# Patient Record
Sex: Female | Born: 1955
Health system: Southern US, Community
[De-identification: ages and names within clinical notes are randomized; demographics above are authoritative.]

## PROBLEM LIST (undated history)

## (undated) DIAGNOSIS — Z8639 Personal history of other endocrine, nutritional and metabolic disease: Secondary | ICD-10-CM

## (undated) DIAGNOSIS — R109 Unspecified abdominal pain: Secondary | ICD-10-CM

## (undated) HISTORY — PX: UPPER GI ENDOSCOPY: SHX6162

---

## 2007-05-04 ENCOUNTER — Encounter (INDEPENDENT_AMBULATORY_CARE_PROVIDER_SITE_OTHER): Payer: Self-pay | Admitting: *Deleted

## 2007-05-04 ENCOUNTER — Encounter: Payer: Self-pay | Admitting: Internal Medicine

## 2007-06-20 ENCOUNTER — Ambulatory Visit: Payer: Self-pay | Admitting: Internal Medicine

## 2007-06-20 DIAGNOSIS — E039 Hypothyroidism, unspecified: Secondary | ICD-10-CM | POA: Insufficient documentation

## 2007-06-20 DIAGNOSIS — Z8639 Personal history of other endocrine, nutritional and metabolic disease: Secondary | ICD-10-CM

## 2007-06-20 DIAGNOSIS — Z862 Personal history of diseases of the blood and blood-forming organs and certain disorders involving the immune mechanism: Secondary | ICD-10-CM

## 2007-06-23 ENCOUNTER — Encounter: Payer: Self-pay | Admitting: Internal Medicine

## 2007-07-02 LAB — CONVERTED CEMR LAB
Basophils Relative: 1 % (ref 0.0–1.0)
Bilirubin, Direct: 0.2 mg/dL (ref 0.0–0.3)
CO2: 31 meq/L (ref 19–32)
Cholesterol: 211 mg/dL (ref 0–200)
Creatinine, Ser: 0.6 mg/dL (ref 0.4–1.2)
Direct LDL: 123.8 mg/dL
Eosinophils Relative: 2.2 % (ref 0.0–5.0)
GFR calc Af Amer: 137 mL/min
Glucose, Bld: 88 mg/dL (ref 70–99)
HCT: 39.6 % (ref 36.0–46.0)
Hemoglobin: 13.7 g/dL (ref 12.0–15.0)
Lymphocytes Relative: 30.1 % (ref 12.0–46.0)
Monocytes Absolute: 0.2 10*3/uL (ref 0.2–0.7)
Monocytes Relative: 3.2 % (ref 3.0–11.0)
Neutro Abs: 3.7 10*3/uL (ref 1.4–7.7)
Neutrophils Relative %: 63.5 % (ref 43.0–77.0)
Potassium: 4.3 meq/L (ref 3.5–5.1)
T3, Free: 3.3 pg/mL (ref 2.3–4.2)
Total Bilirubin: 2.2 mg/dL — ABNORMAL HIGH (ref 0.3–1.2)
Total Protein: 7.6 g/dL (ref 6.0–8.3)
VLDL: 13 mg/dL (ref 0–40)
WBC: 5.9 10*3/uL (ref 4.5–10.5)

## 2007-07-04 ENCOUNTER — Encounter (INDEPENDENT_AMBULATORY_CARE_PROVIDER_SITE_OTHER): Payer: Self-pay | Admitting: *Deleted

## 2007-07-05 ENCOUNTER — Ambulatory Visit: Payer: Self-pay | Admitting: Internal Medicine

## 2007-07-11 ENCOUNTER — Encounter (INDEPENDENT_AMBULATORY_CARE_PROVIDER_SITE_OTHER): Payer: Self-pay | Admitting: *Deleted

## 2007-07-19 ENCOUNTER — Ambulatory Visit: Payer: Self-pay | Admitting: Internal Medicine

## 2007-10-31 ENCOUNTER — Telehealth: Payer: Self-pay | Admitting: Internal Medicine

## 2007-11-09 ENCOUNTER — Encounter (INDEPENDENT_AMBULATORY_CARE_PROVIDER_SITE_OTHER): Payer: Self-pay | Admitting: *Deleted

## 2008-10-09 ENCOUNTER — Ambulatory Visit: Payer: Self-pay | Admitting: Internal Medicine

## 2008-10-10 LAB — CONVERTED CEMR LAB: TSH: 0.04 microintl units/mL — ABNORMAL LOW (ref 0.35–5.50)

## 2008-10-14 ENCOUNTER — Telehealth (INDEPENDENT_AMBULATORY_CARE_PROVIDER_SITE_OTHER): Payer: Self-pay | Admitting: *Deleted

## 2008-10-15 ENCOUNTER — Encounter (HOSPITAL_COMMUNITY): Admission: RE | Admit: 2008-10-15 | Discharge: 2009-01-13 | Payer: Self-pay | Admitting: Internal Medicine

## 2008-10-18 ENCOUNTER — Telehealth (INDEPENDENT_AMBULATORY_CARE_PROVIDER_SITE_OTHER): Payer: Self-pay | Admitting: *Deleted

## 2008-10-23 ENCOUNTER — Encounter: Payer: Self-pay | Admitting: Internal Medicine

## 2009-02-20 ENCOUNTER — Encounter (HOSPITAL_COMMUNITY): Admission: RE | Admit: 2009-02-20 | Discharge: 2009-05-21 | Payer: Self-pay | Admitting: Endocrinology

## 2009-03-07 ENCOUNTER — Ambulatory Visit (HOSPITAL_COMMUNITY): Admission: RE | Admit: 2009-03-07 | Discharge: 2009-03-07 | Payer: Self-pay | Admitting: Endocrinology

## 2010-01-23 ENCOUNTER — Other Ambulatory Visit: Admission: RE | Admit: 2010-01-23 | Discharge: 2010-01-23 | Payer: Self-pay | Admitting: Internal Medicine

## 2010-09-27 ENCOUNTER — Encounter: Payer: Self-pay | Admitting: Internal Medicine

## 2011-06-21 ENCOUNTER — Other Ambulatory Visit (HOSPITAL_COMMUNITY)
Admission: RE | Admit: 2011-06-21 | Discharge: 2011-06-21 | Disposition: A | Discharge status: Home / Self Care | Payer: Managed Care, Other (non HMO) | Source: Ambulatory Visit | Attending: Internal Medicine | Admitting: Internal Medicine

## 2011-06-21 DIAGNOSIS — Z01419 Encounter for gynecological examination (general) (routine) without abnormal findings: Secondary | ICD-10-CM | POA: Insufficient documentation

## 2011-08-18 ENCOUNTER — Other Ambulatory Visit: Payer: Self-pay | Admitting: Internal Medicine

## 2011-08-18 DIAGNOSIS — R29898 Other symptoms and signs involving the musculoskeletal system: Secondary | ICD-10-CM

## 2011-08-23 ENCOUNTER — Ambulatory Visit
Admission: RE | Admit: 2011-08-23 | Discharge: 2011-08-23 | Disposition: A | Discharge status: Home / Self Care | Payer: Managed Care, Other (non HMO) | Source: Ambulatory Visit | Attending: Internal Medicine | Admitting: Internal Medicine

## 2011-08-23 DIAGNOSIS — R29898 Other symptoms and signs involving the musculoskeletal system: Secondary | ICD-10-CM

## 2011-09-30 ENCOUNTER — Other Ambulatory Visit: Payer: Self-pay | Admitting: Internal Medicine

## 2011-09-30 DIAGNOSIS — E041 Nontoxic single thyroid nodule: Secondary | ICD-10-CM

## 2012-02-02 ENCOUNTER — Other Ambulatory Visit: Payer: Managed Care, Other (non HMO)

## 2012-04-03 ENCOUNTER — Ambulatory Visit
Admission: RE | Admit: 2012-04-03 | Discharge: 2012-04-03 | Disposition: A | Discharge status: Home / Self Care | Payer: Managed Care, Other (non HMO) | Source: Ambulatory Visit | Attending: Internal Medicine | Admitting: Internal Medicine

## 2012-04-03 DIAGNOSIS — E041 Nontoxic single thyroid nodule: Secondary | ICD-10-CM

## 2013-02-07 ENCOUNTER — Other Ambulatory Visit: Payer: Self-pay | Admitting: Internal Medicine

## 2013-02-07 DIAGNOSIS — E041 Nontoxic single thyroid nodule: Secondary | ICD-10-CM

## 2013-02-26 ENCOUNTER — Other Ambulatory Visit: Payer: Managed Care, Other (non HMO)

## 2013-10-13 IMAGING — US US SOFT TISSUE HEAD/NECK
1 series · 14 of 25 positions shown · non-contrast
Comparison: None.

CLINICAL DATA: Neck tightness.  History of goiter and
hyperthyroidism.

THYROID ULTRASOUND
TECHNIQUE: Ultrasound examination of the thyroid gland and adjacent
soft tissues was performed.

[Series 1: us soft tissue head/neck · 0.06mm/px · 14 of 44 slices shown]
[im 1/44]
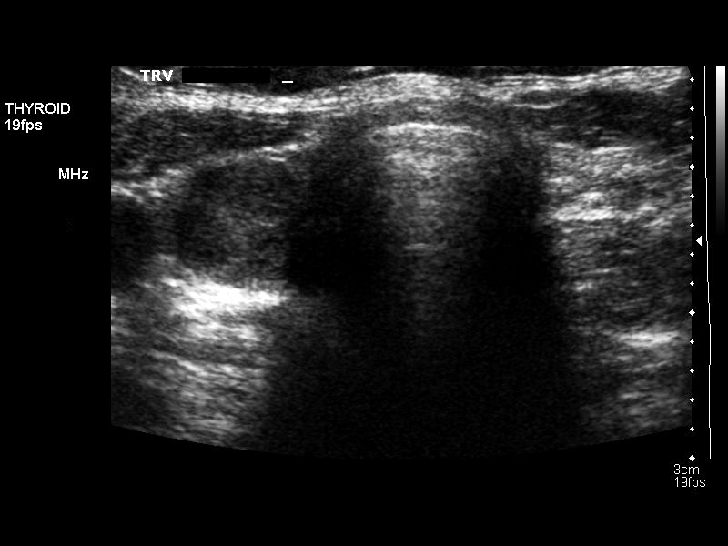
[im 4/44]
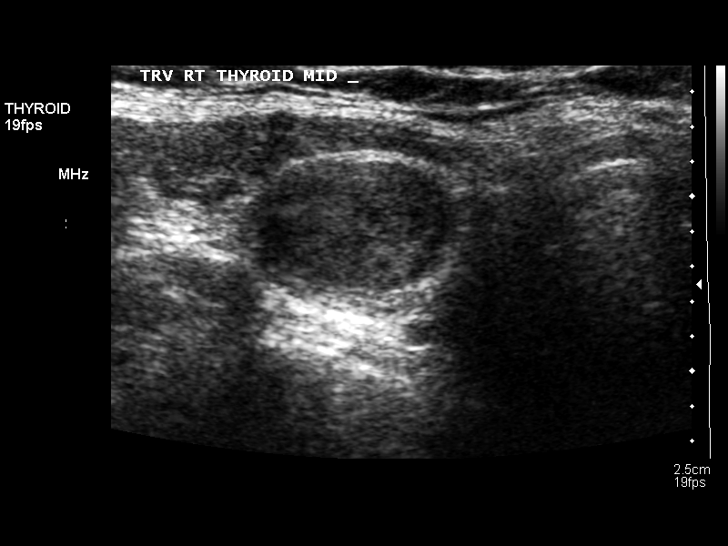
[im 8/44]
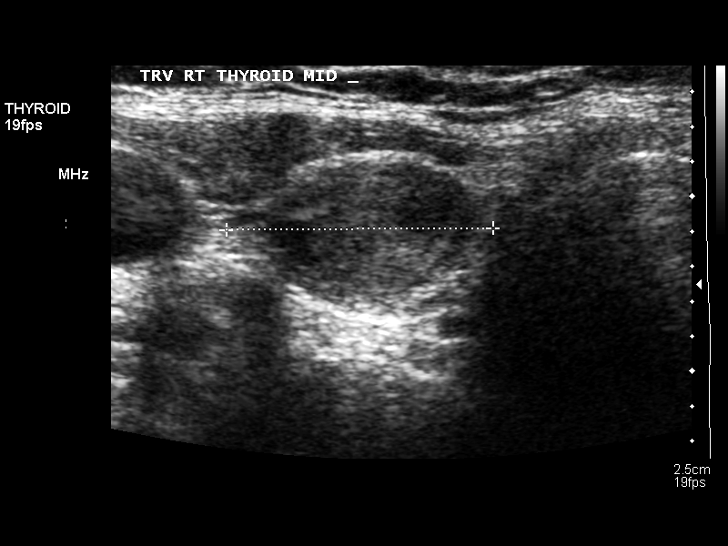
[im 11/44]
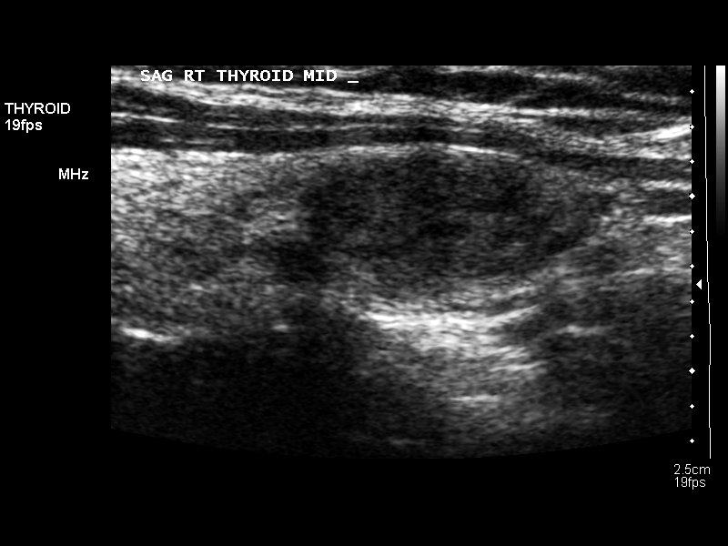
[im 15/44]
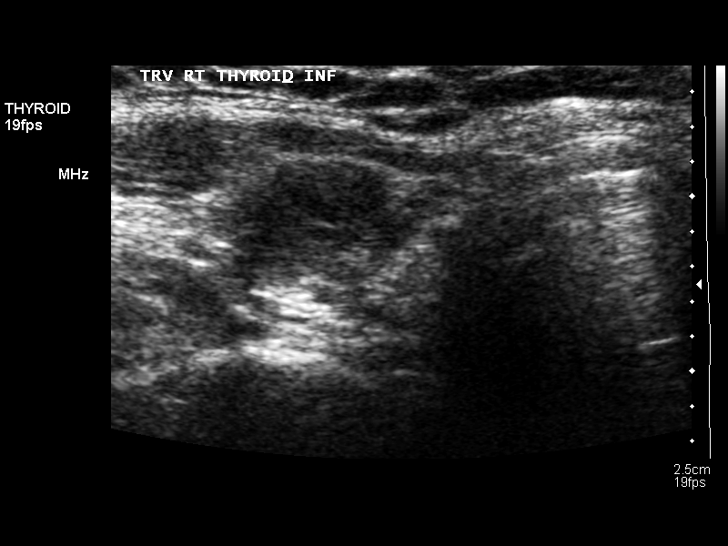
[im 17/44]
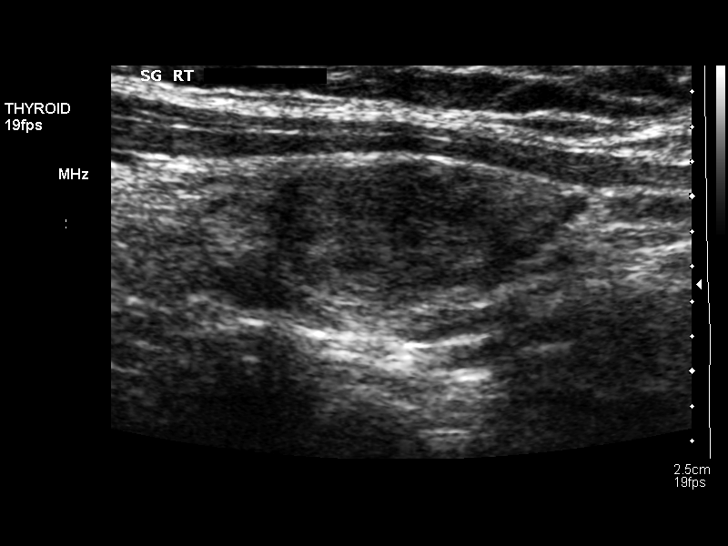
[im 20/44]
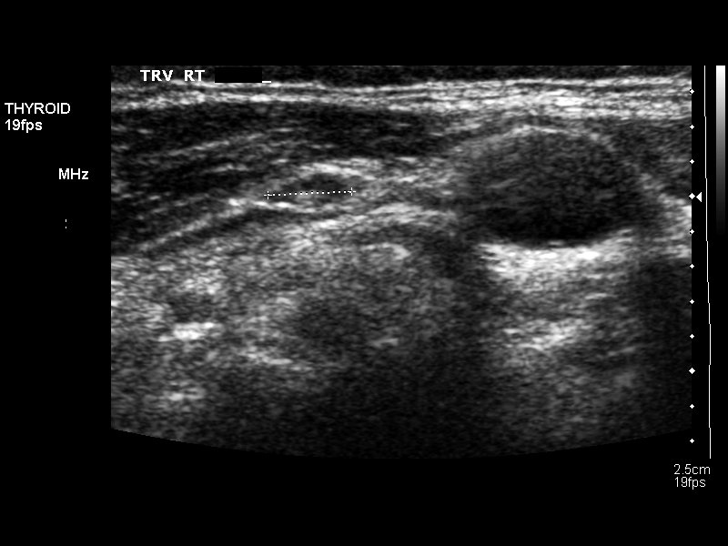
[im 24/44]
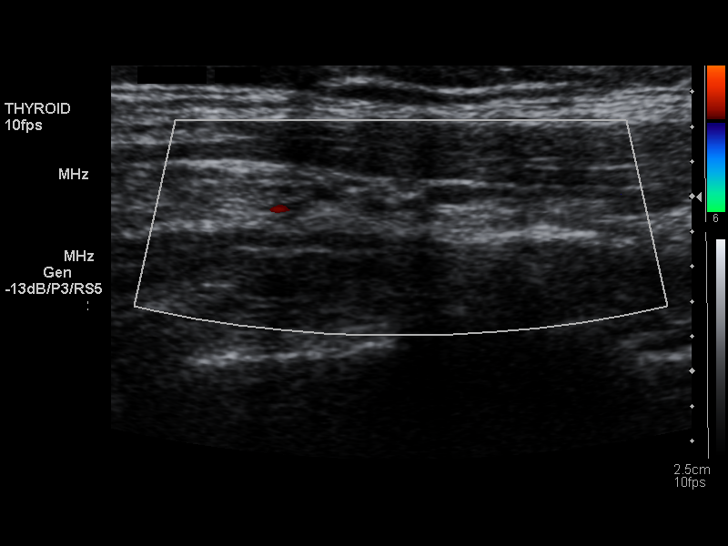
[im 27/44]
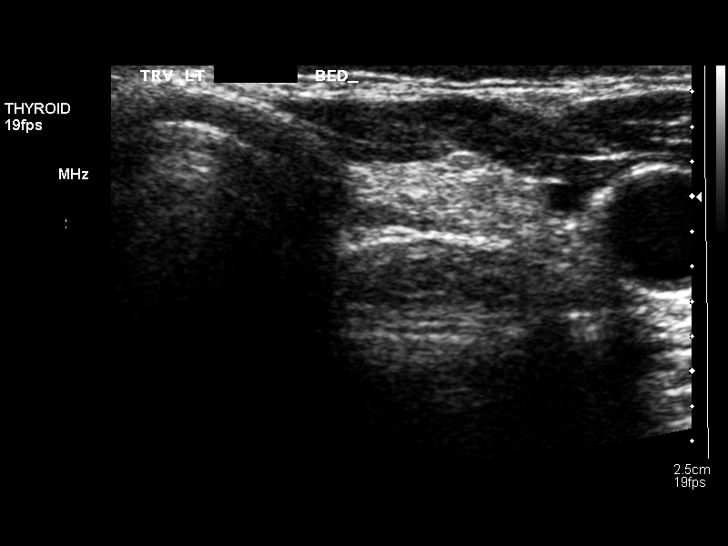
[im 29/44]
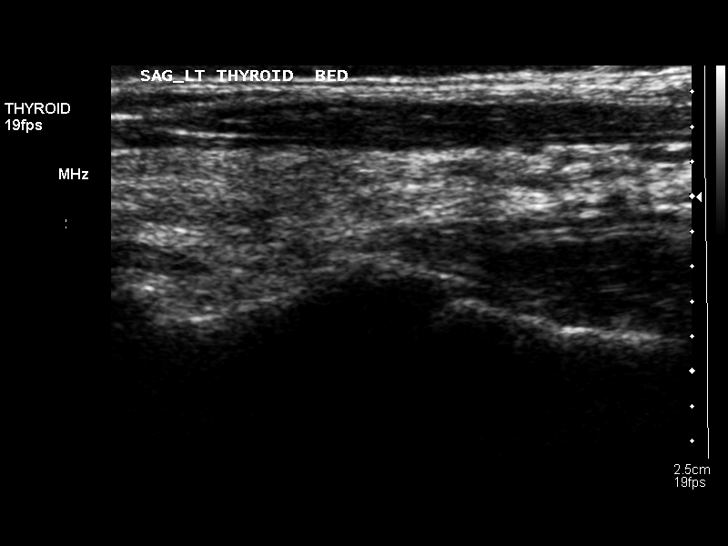
[im 33/44]
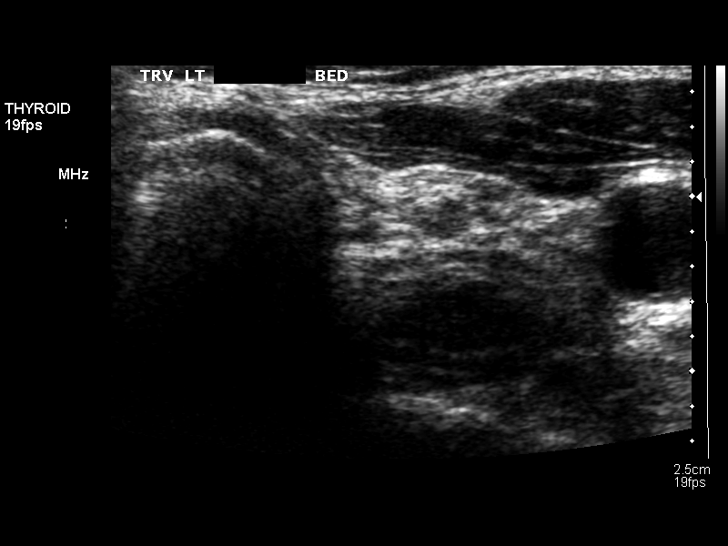
[im 36/44]
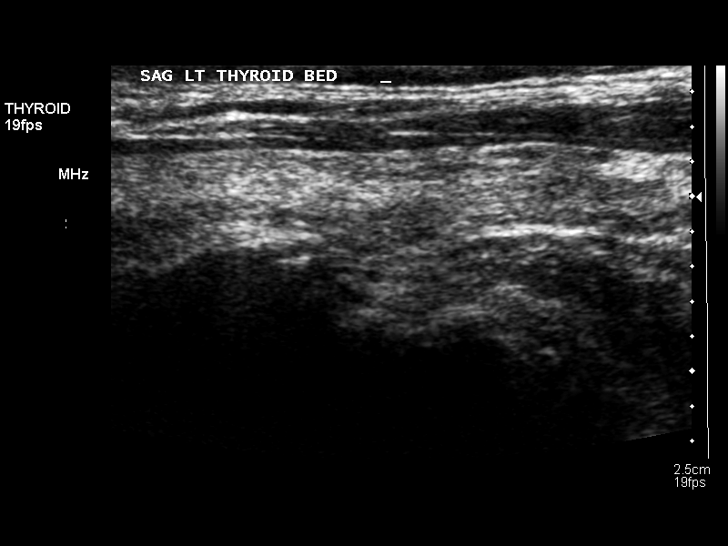
[im 40/44]
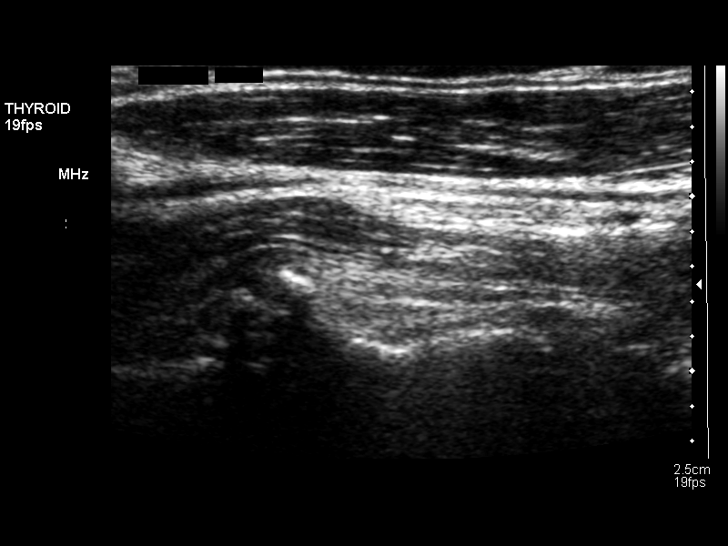
[im 44/44]
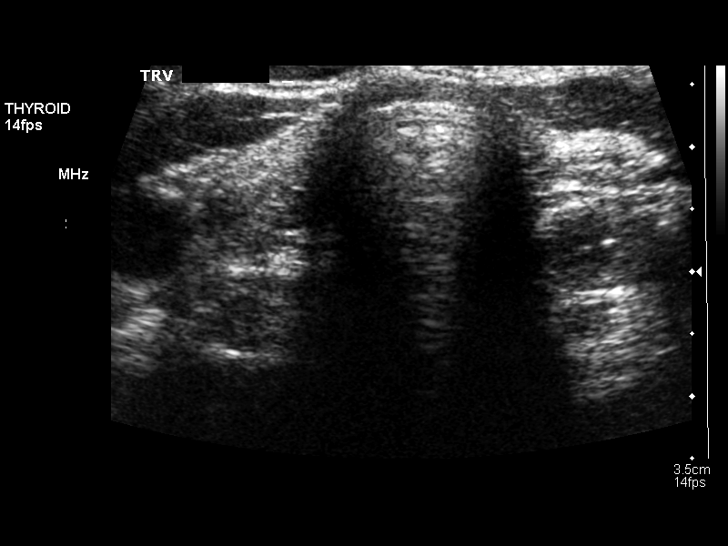

[14 of 25 positions shown; findings below may reference images not displayed]

FINDINGS: Right thyroid lobe:  Ill-defined residual right-sided thyroid lobe
remains.  This measures 2.3 x 1.0 x 1.5 cm.
Left thyroid lobe:  No residual left-sided thyroid tissue
identified.
Isthmus:  No definite residual isthmic tissue identified.

Focal nodules:  1.2 x 0.8 x 1.3 cm nodule is identified within the
anterior aspect of the inter/lower pole right lobe. Solid, and
relatively isoechoic.  No calcifications within.

Lymphadenopathy:  None visualized.
IMPRESSION: 1.  Lack of visualization of the isthmus or left lobe of the
thyroid.  Likely related to prior radioactive iodine therapy.
2. Residual right-sided isthmic tissue with an indeterminate 1.3 cm
nodule identified. Consider ultrasound surveillance at 6 - 12
months.

## 2015-06-23 ENCOUNTER — Other Ambulatory Visit: Payer: Self-pay | Admitting: Internal Medicine

## 2015-06-23 DIAGNOSIS — R945 Abnormal results of liver function studies: Principal | ICD-10-CM

## 2015-06-23 DIAGNOSIS — R7989 Other specified abnormal findings of blood chemistry: Secondary | ICD-10-CM

## 2015-07-07 ENCOUNTER — Ambulatory Visit
Admission: RE | Admit: 2015-07-07 | Discharge: 2015-07-07 | Disposition: A | Discharge status: Home / Self Care | Payer: Managed Care, Other (non HMO) | Source: Ambulatory Visit | Attending: Internal Medicine | Admitting: Internal Medicine

## 2015-07-07 ENCOUNTER — Encounter (INDEPENDENT_AMBULATORY_CARE_PROVIDER_SITE_OTHER): Payer: Self-pay

## 2015-07-07 DIAGNOSIS — R7989 Other specified abnormal findings of blood chemistry: Secondary | ICD-10-CM

## 2015-07-07 DIAGNOSIS — R945 Abnormal results of liver function studies: Principal | ICD-10-CM

## 2016-06-03 ENCOUNTER — Other Ambulatory Visit: Payer: Self-pay | Admitting: Gastroenterology

## 2016-06-04 ENCOUNTER — Encounter (HOSPITAL_COMMUNITY): Payer: Self-pay | Admitting: *Deleted

## 2016-06-07 ENCOUNTER — Encounter (HOSPITAL_COMMUNITY): Payer: Self-pay | Admitting: Anesthesiology

## 2016-06-07 NOTE — Progress Notes (Signed)
Interpreter confirmation email on chart for 06-08-16

## 2016-06-07 NOTE — Anesthesia Preprocedure Evaluation (Deleted)
Anesthesia Evaluation  Patient identified by MRN, date of birth, ID band Patient awake    Reviewed: Allergy & Precautions, NPO status , Patient's Chart, lab work & pertinent test results  Airway        Dental   Pulmonary           Cardiovascular      Neuro/Psych    GI/Hepatic   Endo/Other  Hypothyroidism H/o Grave's disease s/p radiation  Renal/GU      Musculoskeletal   Abdominal   Peds  Hematology   Anesthesia Other Findings   Reproductive/Obstetrics                             Anesthesia Physical Anesthesia Plan  ASA: II  Anesthesia Plan: MAC   Post-op Pain Management:    Induction: Intravenous  Airway Management Planned: Natural Airway and Nasal Cannula  Additional Equipment:   Intra-op Plan:   Post-operative Plan:   Informed Consent:   Dental advisory given  Plan Discussed with: CRNA  Anesthesia Plan Comments:         Anesthesia Quick Evaluation

## 2016-06-08 ENCOUNTER — Ambulatory Visit (HOSPITAL_COMMUNITY)
Admission: RE | Admit: 2016-06-08 | Payer: Managed Care, Other (non HMO) | Source: Ambulatory Visit | Admitting: Gastroenterology

## 2016-06-08 HISTORY — DX: Personal history of other endocrine, nutritional and metabolic disease: Z86.39

## 2016-06-08 HISTORY — DX: Unspecified abdominal pain: R10.9

## 2016-06-08 SURGERY — ESOPHAGOGASTRODUODENOSCOPY (EGD) WITH PROPOFOL
Anesthesia: Monitor Anesthesia Care

## 2016-06-08 MED ORDER — PROPOFOL 10 MG/ML IV BOLUS
INTRAVENOUS | Status: AC
  Filled 2016-06-08: qty 40

## 2019-05-09 ENCOUNTER — Other Ambulatory Visit: Payer: Self-pay | Admitting: Internal Medicine

## 2019-05-09 DIAGNOSIS — R634 Abnormal weight loss: Secondary | ICD-10-CM

## 2019-05-16 ENCOUNTER — Ambulatory Visit
Admission: RE | Admit: 2019-05-16 | Discharge: 2019-05-16 | Disposition: A | Discharge status: Home / Self Care | Payer: No Typology Code available for payment source | Source: Ambulatory Visit | Attending: Internal Medicine | Admitting: Internal Medicine

## 2019-05-16 DIAGNOSIS — R634 Abnormal weight loss: Secondary | ICD-10-CM

## 2019-05-21 ENCOUNTER — Ambulatory Visit: Payer: No Typology Code available for payment source | Attending: Internal Medicine | Admitting: Physical Therapy

## 2019-05-21 ENCOUNTER — Other Ambulatory Visit: Payer: Self-pay

## 2019-05-21 ENCOUNTER — Encounter: Payer: Self-pay | Admitting: Physical Therapy

## 2019-05-21 VITALS — BP 106/64 | HR 74

## 2019-05-21 DIAGNOSIS — R2681 Unsteadiness on feet: Secondary | ICD-10-CM | POA: Diagnosis present

## 2019-05-21 DIAGNOSIS — R42 Dizziness and giddiness: Secondary | ICD-10-CM | POA: Insufficient documentation

## 2019-05-21 NOTE — Therapy (Signed)
[facility] [address]  [address] [place], [place], [address] Phone: [phone]   Fax:  [phone]  Physical Therapy Evaluation  Patient Details  Name: Erin Sims MRN: [id number] Date of Birth: 1956-02-06 Referring Provider (PT): Talmage Coin, MD    Encounter Date: 05/21/2019  PT End of Session - 05/21/19 1203    Visit Number  1    Number of Visits  9    Date for PT Re-Evaluation  06/18/19    Authorization Type  [employer]    PT Start Time  1100    PT Stop Time  1154    PT Time Calculation (min)  54 min    Activity Tolerance  Patient tolerated treatment well    Behavior During Therapy  [facility] for tasks assessed/performed       Past Medical History:  Diagnosis Date  . Abdominal discomfort   . History of Graves' disease    "radaition therapy 2010 to kill thyroid function", now takes supplement" "Synthroid orally only"- Dr. Sharl Ma follows    Past Surgical History:  Procedure Laterality Date  . CESAREAN SECTION     x 1 '82 "[place]"  . UPPER GI ENDOSCOPY     done in [place] "? some ? polyp"    Vitals:   05/21/19 1114  BP: 106/64  Pulse: 74  SpO2: 94%     Subjective Assessment - 05/21/19 1105    Subjective  Patient present with husband. Patient reports that she has been mildly dizzy in the AM as soon as she wakes up, before she starts to move. This has been present for 1 year. However, later states that she has only been dizzy one time- husband states otherwise. Fell in July and is not sure why- said she didn't feel anything. Has been taking her BP at home and is usually 110/72mmHg. Denies dizziness or spinning, otalgia. Described tinnitus in her R ear in August which has now resolved. Last dizzy spell was in August. Does report that she is unable to look around or use her phone when she is in the car because of dizziness. Believes that her dizziness may be caused by her pancreas or pituitary gland. Is supposed to be  scheduled for an MRI on her pancreas soon.    Patient is accompained by:  Family member   husband   Pertinent History  graves disease, thyroid nodule, HLD, osteopenia    Limitations  House hold activities    Patient Stated Goals  get rid of dizziness    Currently in Pain?  No/denies         [facility] PT Assessment - 05/21/19 0001      Assessment   Medical Diagnosis  Vertigo    Referring Provider (PT)  Talmage Coin, MD     Onset Date/Surgical Date  05/20/18    Next MD Visit  not scheduled      Precautions   Precautions  --   osteopenia     Balance Screen   Has the patient fallen in the past 6 months  Yes    How many times?  1    Has the patient had a decrease in activity level because of a fear of falling?   No    Is the patient reluctant to leave their home because of a fear of falling?   No      Home Public house manager residence    Living Arrangements  Spouse/significant other    Available Help at Discharge  Family    Type of Home  House    Home Access  Stairs to enter    Entrance Stairs-Number of Steps  4    Entrance Stairs-Rails  Left    Home Layout  Two level    Alternate Level Stairs-Number of Steps  15    Alternate Level Stairs-Rails  Left      Prior Function   Level of Independence  Independent    Vocation  Unemployed    Vocation Requirements  homemaker    Leisure  none      Cognition   Overall Cognitive Status  Within Functional Limits for tasks assessed      Coordination   Gross Motor Movements are Fluid and Coordinated  Yes      Posture/Postural Control   Posture/Postural Control  Postural limitations    Postural Limitations  Rounded Shoulders;Increased thoracic kyphosis      Ambulation/Gait   Gait Pattern  Step-through pattern;Within Functional Limits    Ambulation Surface  Level;Indoor    Gait velocity  [facility]      Functional Gait  Assessment   Gait assessed   --           Vestibular Assessment - 05/21/19 0001       Oculomotor Exam   Ocular ROM  intact    Spontaneous  Absent    Gaze-induced   Right beating nystagmus with R gaze    Head shaking Horizontal  --    Head Shaking Vertical  --    Smooth Pursuits  Saccades   R beating saccade to R   Saccades  Intact;Slow    Comment  convergence intact      Oculomotor Exam-Fixation Suppressed    Left Head Impulse  negative    Right Head Impulse  negative      Vestibulo-Ocular Reflex   VOR 1 Head Only (x 1 viewing)  horizontal VOR intact; vertical VOR with intermittent difficulty focusing     VOR Cancellation  Corrective saccades   to R     Positional Testing   Dix-Hallpike  Dix-Hallpike Right;Dix-Hallpike Left      Dix-Hallpike Right   Dix-Hallpike Right Symptoms  No nystagmus      Dix-Hallpike Left   Dix-Hallpike Left Symptoms  No nystagmus          Objective measurements completed on examination: See above findings.              PT Education - 05/21/19 1202    Education Details  edu and explanation of test results; encouraged to return for continued testing to assess balance and fall risk    Person(s) Educated  Patient;Spouse    Methods  Explanation;Demonstration    Comprehension  Verbalized understanding       PT Short Term Goals - 05/21/19 1211      PT SHORT TERM GOAL #1   Title  Patient to be independent with initial HEP.    Time  2    Period  Weeks    Status  New    Target Date  06/04/19        PT Long Term Goals - 05/21/19 1211      PT LONG TERM GOAL #1   Title  Patient to be independent with advanced HEP.    Time  4    Period  Weeks    Status  New    Target Date  06/18/19  PT LONG TERM GOAL #2   Title  Patient to score >19 on DGI in order to decrease fall risk.    Time  4    Period  Weeks    Status  New    Target Date  06/18/19      PT LONG TERM GOAL #3   Title  Patient to demonstrate mild sway with M-CTSIB condiction EC/foam surface to improve confidence when walking in the dark and on  unstable surfaces.    Time  4    Period  Weeks    Status  New    Target Date  06/18/19      PT LONG TERM GOAL #4   Title  Patient to report 50% improvement in dizziness when riding in the car.    Time  4    Period  Weeks    Status  New    Target Date  06/18/19             Plan - 05/21/19 1203    Clinical Impression Statement  Patient is a 63y/o F presenting to OPPT with husband with c/o dizziness and imbalance. Subjective report limited d/t use of interpreter throughout appointment. Patient reporting feeling "uncomfortable" for the past year, with report of dizziness as soon as she wakes up, before she starts to move. Also reports that she is unable to look around or use her phone when she is in the car because of dizziness. However, later reports that she has only been dizzy one time- husband disagrees. Denies dizziness or spinning, otalgia. Described tinnitus in her R ear in August which has now resolved. Patient today presented with R beating nystagmus with R gaze, corrective saccades to R with smooth pursuit, and corrective saccades to R with VOR cancellation. Balance not tested today d/t limited time. Patient's test results are more suggestive of a central cause of dizziness. Educated patient on her results and encouraged her to return to assess balance and fall risk. Would benefit from skilled PT services 2x/week for 4 weeks as needed for dizziness and imbalance.    Personal Factors and Comorbidities  Age;Comorbidity 3+;Past/Current Experience;Time since onset of injury/illness/exacerbation;Social Background    Comorbidities  graves disease, thyroid nodule, HLD, osteopenia    Examination-Activity Limitations  Bed Mobility;Sleep    Examination-Participation Restrictions  Driving    Stability/Clinical Decision Making  Unstable/Unpredictable    Clinical Decision Making  High    Rehab Potential  Good    PT Frequency  2x / week    PT Duration  4 weeks    PT Treatment/Interventions   ADLs/Self Care Home Management;Canalith Repostioning;Cryotherapy;Moist Heat;Balance training;Therapeutic exercise;Therapeutic activities;Functional mobility training;Stair training;Gait training;Neuromuscular re-education;Patient/family education;Orthotic Fit/Training;Manual techniques;Vestibular;Energy conservation;Passive range of motion    PT Next Visit Plan  DGI, M-CTSIB, administer HEP    Consulted and Agree with Plan of Care  Patient;Family member/caregiver    Family Member Consulted  husband       Patient will benefit from skilled therapeutic intervention in order to improve the following deficits and impairments:  Decreased activity tolerance, Decreased balance, Dizziness, Postural dysfunction  Visit Diagnosis: Dizziness and giddiness  Unsteadiness on feet     Problem List Patient Active Problem List   Diagnosis Date Noted  . HYPOTHYROIDISM 06/20/2007  . GRAVE'S DISEASE, HX OF 06/20/2007     Anette GuarneriYevgeniya Oris Calmes, PT, DPT 05/21/19 12:14 PM   [facility] [place] [address]  [address] [place], [place], [address] Phone: [phone]  Fax:  [phone]  Name: Erin Sims MRN: [id number] Date of Birth: [birth year]

## 2019-05-22 ENCOUNTER — Other Ambulatory Visit: Payer: Self-pay | Admitting: Internal Medicine

## 2019-05-22 DIAGNOSIS — R634 Abnormal weight loss: Secondary | ICD-10-CM

## 2019-05-22 DIAGNOSIS — R109 Unspecified abdominal pain: Secondary | ICD-10-CM

## 2019-05-22 DIAGNOSIS — R935 Abnormal findings on diagnostic imaging of other abdominal regions, including retroperitoneum: Secondary | ICD-10-CM

## 2019-06-07 ENCOUNTER — Encounter: Payer: Self-pay | Admitting: Physical Therapy

## 2019-06-07 ENCOUNTER — Other Ambulatory Visit: Payer: Self-pay

## 2019-06-07 ENCOUNTER — Ambulatory Visit: Payer: No Typology Code available for payment source | Attending: Internal Medicine | Admitting: Physical Therapy

## 2019-06-07 DIAGNOSIS — R2681 Unsteadiness on feet: Secondary | ICD-10-CM | POA: Diagnosis present

## 2019-06-07 DIAGNOSIS — R42 Dizziness and giddiness: Secondary | ICD-10-CM | POA: Diagnosis not present

## 2019-06-07 NOTE — Therapy (Addendum)
[facility] [address]  [place], [place], [address] Phone: [phone]   Fax:  [phone]  Physical Therapy Treatment  Patient Details  Name: Erin Sims MRN: [id number] Date of Birth: 04/06/1956 Referring Provider (PT): Delrae Rend, MD    Encounter Date: 06/07/2019  PT End of Session - 06/07/19 1212    Visit Number  2    Number of Visits  9    Date for PT Re-Evaluation  06/18/19    Authorization Type  [employer]ign[employer]    PT Start Time  1020    PT Stop Time  1107    PT Time Calculation (min)  47 min    Equipment Utilized During Treatment  Gait belt    Activity Tolerance  Patient tolerated treatment well    Behavior During Therapy  [facility] for tasks assessed/performed       Past Medical History:  Diagnosis Date  . Abdominal discomfort   . History of Graves' disease    "radaition therapy 2010 to kill thyroid function", now takes supplement" "Synthroid orally only"- Dr. Buddy Duty follows    Past Surgical History:  Procedure Laterality Date  . CESAREAN SECTION     x 1 '82 "[place]"  . UPPER GI ENDOSCOPY     done in [place] "? some ? polyp"    There were no vitals filed for this visit.  Subjective Assessment - 06/07/19 1022    Subjective  Patient present with husband. Notes that she still gets dizzy intermittently spontaneously. Sometimes worse with quick head turns and this makes her feel unstable. Denies recent falls. Can feel a sound in her R ear.    Pertinent History  graves disease, thyroid nodule, HLD, osteopenia    Patient Stated Goals  get rid of dizziness    Currently in Pain?  No/denies         [facility] PT Assessment - 06/07/19 0001      Balance   Balance Assessed  Yes      Static Standing Balance   Static Standing Balance -  Activities   Romberg - Eyes Opened;Romberg - Eyes Closed;Romberg - Eyes Opened, Foam;Romberg - Eyes Closed , Foam    Static Standing - Comment/# of Minutes  M-CTSIB EO/firm-WNL;  EC/firm- mild sway; EO/foam- mild sway; EO/foam-moderate sway      Standardized Balance Assessment   Standardized Balance Assessment  Dynamic Gait Index      Dynamic Gait Index   Level Surface  Normal    Change in Gait Speed  Normal    Gait with Horizontal Head Turns  Normal    Gait with Vertical Head Turns  Normal    Gait and Pivot Turn  Normal   c/o fuzzy headedness   Step Over Obstacle  Mild Impairment    Step Around Obstacles  Normal   c/o feeling delayed and fuzyz headed   Steps  Normal    Total Score  23                           PT Education - 06/07/19 1210    Education Details  lengthy discussion with patient and husband on test results, interpretation,and clinical relevance; advised patient to speak with PCP d/t concerns about vertigo and previous cervical CT scan    Person(s) Educated  Patient;Spouse   husband   Methods  Explanation;Demonstration    Comprehension  Verbalized understanding  PT Short Term Goals - 06/07/19 1220      PT SHORT TERM GOAL #1   Title  Patient to be independent with initial HEP.    Time  2    Period  Weeks    Status  On-going    Target Date  06/04/19        PT Long Term Goals - 06/07/19 1220      PT LONG TERM GOAL #1   Title  Patient to be independent with advanced HEP.    Time  4    Period  Weeks    Status  On-going      PT LONG TERM GOAL #2   Title  Patient to score >19 on DGI in order to decrease fall risk.    Time  4    Period  Weeks    Status  Achieved      PT LONG TERM GOAL #3   Title  Patient to demonstrate mild sway with M-CTSIB condiction EC/foam surface to improve confidence when walking in the dark and on unstable surfaces.    Time  4    Period  Weeks    Status  On-going      PT LONG TERM GOAL #4   Title  Patient to report 50% improvement in dizziness when riding in the car.    Time  4    Period  Weeks    Status  On-going            Plan - 06/07/19 1212    Clinical  Impression Statement  Patient present with husband. Reports continued but intermittent spontaneous dizziness, sometimes worse with quick head movements. Also endorses tinnitus in R ear. Increased time spent on edu, explanation, and demonstration of testing details and results as interpreter was used this session. Patient and husband also with several questions about test results and subsequent clinical relevance. Patient demonstrated increased sway on M-CTSIB condition EC/foam surface, indicating vestibular involvement as part of her imbalance. Scored 23/24 on DGI, indicating decreased risk of falls. Patient did report a strange feeling in her head with pivot turn and walking around obstacles, but had difficulty describing it. Denied dizziness. Educated patient and husband on test results. D/t patient's oculomotor exam indicating possible central cause of dizziness last session combined with previous cervical CT that patient reports was abnormal, patient reporting she would like to seek answers from neurologist before continuing with PT.    Comorbidities  graves disease, thyroid nodule, HLD, osteopenia    PT Treatment/Interventions  ADLs/Self Care Home Management;Canalith Repostioning;Cryotherapy;Moist Heat;Balance training;Therapeutic exercise;Therapeutic activities;Functional mobility training;Stair training;Gait training;Neuromuscular re-education;Patient/family education;Orthotic Fit/Training;Manual techniques;Vestibular;Energy conservation;Passive range of motion    PT Next Visit Plan  balance training with EC/foam surface; turning and VOR training    Consulted and Agree with Plan of Care  Patient;Family member/caregiver    Family Member Consulted  husband       Patient will benefit from skilled therapeutic intervention in order to improve the following deficits and impairments:  Decreased activity tolerance, Decreased balance, Dizziness, Postural dysfunction  Visit Diagnosis: Dizziness and  giddiness  Unsteadiness on feet     Problem List Patient Active Problem List   Diagnosis Date Noted  . HYPOTHYROIDISM 06/20/2007  . GRAVE'S DISEASE, HX OF 06/20/2007     Janene Harvey, PT, DPT 06/07/19 12:22 PM   [facility] [address]  [place], [place], [address] Phone: [phone]   Fax:  [phone]  Name: Erin Sims MRN: [id number] Date of Birth: 06/04/1956  PHYSICAL THERAPY DISCHARGE SUMMARY  Visits from Start of Care: 2  Current functional level related to goals / functional outcomes: Unable to assess; patient did not return   Remaining deficits: Unable to assess    Education / Equipment: N/A  Plan: Patient agrees to discharge.  Patient goals were not met. Patient is being discharged due to the patient's request.  ?????           Janene Harvey, PT, DPT 07/09/19 1:38 PM

## 2019-06-15 ENCOUNTER — Other Ambulatory Visit: Payer: PRIVATE HEALTH INSURANCE

## 2019-11-08 DIAGNOSIS — E0500 Thyrotoxicosis with diffuse goiter without thyrotoxic crisis or storm: Secondary | ICD-10-CM | POA: Diagnosis not present

## 2019-11-08 DIAGNOSIS — E890 Postprocedural hypothyroidism: Secondary | ICD-10-CM | POA: Diagnosis not present

## 2019-11-08 DIAGNOSIS — E041 Nontoxic single thyroid nodule: Secondary | ICD-10-CM | POA: Diagnosis not present

## 2019-11-08 DIAGNOSIS — Q453 Other congenital malformations of pancreas and pancreatic duct: Secondary | ICD-10-CM | POA: Diagnosis not present

## 2019-11-09 DIAGNOSIS — Q453 Other congenital malformations of pancreas and pancreatic duct: Secondary | ICD-10-CM | POA: Diagnosis not present

## 2019-11-09 DIAGNOSIS — E890 Postprocedural hypothyroidism: Secondary | ICD-10-CM | POA: Diagnosis not present

## 2019-11-09 DIAGNOSIS — R7309 Other abnormal glucose: Secondary | ICD-10-CM | POA: Diagnosis not present

## 2019-11-16 DIAGNOSIS — Z1231 Encounter for screening mammogram for malignant neoplasm of breast: Secondary | ICD-10-CM | POA: Diagnosis not present

## 2019-12-11 DIAGNOSIS — R140 Abdominal distension (gaseous): Secondary | ICD-10-CM | POA: Diagnosis not present

## 2019-12-26 DIAGNOSIS — E890 Postprocedural hypothyroidism: Secondary | ICD-10-CM | POA: Diagnosis not present

## 2019-12-28 DIAGNOSIS — Z01419 Encounter for gynecological examination (general) (routine) without abnormal findings: Secondary | ICD-10-CM | POA: Diagnosis not present

## 2020-01-07 DIAGNOSIS — H2513 Age-related nuclear cataract, bilateral: Secondary | ICD-10-CM | POA: Diagnosis not present

## 2020-01-07 DIAGNOSIS — H43311 Vitreous membranes and strands, right eye: Secondary | ICD-10-CM | POA: Diagnosis not present

## 2020-01-07 DIAGNOSIS — H35372 Puckering of macula, left eye: Secondary | ICD-10-CM | POA: Diagnosis not present

## 2020-02-18 DIAGNOSIS — M545 Low back pain: Secondary | ICD-10-CM | POA: Diagnosis not present

## 2020-02-26 DIAGNOSIS — M7062 Trochanteric bursitis, left hip: Secondary | ICD-10-CM | POA: Diagnosis not present

## 2020-02-26 DIAGNOSIS — M48061 Spinal stenosis, lumbar region without neurogenic claudication: Secondary | ICD-10-CM | POA: Diagnosis not present

## 2020-02-26 DIAGNOSIS — M4802 Spinal stenosis, cervical region: Secondary | ICD-10-CM | POA: Diagnosis not present

## 2020-02-26 DIAGNOSIS — M6281 Muscle weakness (generalized): Secondary | ICD-10-CM | POA: Diagnosis not present

## 2020-03-05 DIAGNOSIS — M7062 Trochanteric bursitis, left hip: Secondary | ICD-10-CM | POA: Diagnosis not present

## 2020-03-05 DIAGNOSIS — M6281 Muscle weakness (generalized): Secondary | ICD-10-CM | POA: Diagnosis not present

## 2020-03-05 DIAGNOSIS — M4802 Spinal stenosis, cervical region: Secondary | ICD-10-CM | POA: Diagnosis not present

## 2020-03-05 DIAGNOSIS — M48061 Spinal stenosis, lumbar region without neurogenic claudication: Secondary | ICD-10-CM | POA: Diagnosis not present

## 2020-03-06 DIAGNOSIS — R7301 Impaired fasting glucose: Secondary | ICD-10-CM | POA: Diagnosis not present

## 2020-03-06 DIAGNOSIS — Z0000 Encounter for general adult medical examination without abnormal findings: Secondary | ICD-10-CM | POA: Diagnosis not present

## 2020-03-06 DIAGNOSIS — E890 Postprocedural hypothyroidism: Secondary | ICD-10-CM | POA: Diagnosis not present

## 2020-03-06 DIAGNOSIS — K861 Other chronic pancreatitis: Secondary | ICD-10-CM | POA: Diagnosis not present

## 2020-03-11 DIAGNOSIS — R140 Abdominal distension (gaseous): Secondary | ICD-10-CM | POA: Diagnosis not present

## 2020-03-11 DIAGNOSIS — K861 Other chronic pancreatitis: Secondary | ICD-10-CM | POA: Diagnosis not present

## 2020-03-17 DIAGNOSIS — M6281 Muscle weakness (generalized): Secondary | ICD-10-CM | POA: Diagnosis not present

## 2020-03-17 DIAGNOSIS — M48061 Spinal stenosis, lumbar region without neurogenic claudication: Secondary | ICD-10-CM | POA: Diagnosis not present

## 2020-03-17 DIAGNOSIS — M4802 Spinal stenosis, cervical region: Secondary | ICD-10-CM | POA: Diagnosis not present

## 2020-03-17 DIAGNOSIS — M7062 Trochanteric bursitis, left hip: Secondary | ICD-10-CM | POA: Diagnosis not present

## 2020-03-27 DIAGNOSIS — M4802 Spinal stenosis, cervical region: Secondary | ICD-10-CM | POA: Diagnosis not present

## 2020-03-27 DIAGNOSIS — M48061 Spinal stenosis, lumbar region without neurogenic claudication: Secondary | ICD-10-CM | POA: Diagnosis not present

## 2020-03-27 DIAGNOSIS — M6281 Muscle weakness (generalized): Secondary | ICD-10-CM | POA: Diagnosis not present

## 2020-03-27 DIAGNOSIS — M7062 Trochanteric bursitis, left hip: Secondary | ICD-10-CM | POA: Diagnosis not present

## 2020-04-03 DIAGNOSIS — M48061 Spinal stenosis, lumbar region without neurogenic claudication: Secondary | ICD-10-CM | POA: Diagnosis not present

## 2020-04-03 DIAGNOSIS — M7062 Trochanteric bursitis, left hip: Secondary | ICD-10-CM | POA: Diagnosis not present

## 2020-04-03 DIAGNOSIS — M4802 Spinal stenosis, cervical region: Secondary | ICD-10-CM | POA: Diagnosis not present

## 2020-04-03 DIAGNOSIS — M6281 Muscle weakness (generalized): Secondary | ICD-10-CM | POA: Diagnosis not present

## 2020-05-07 DIAGNOSIS — R079 Chest pain, unspecified: Secondary | ICD-10-CM | POA: Diagnosis not present

## 2020-05-07 DIAGNOSIS — R531 Weakness: Secondary | ICD-10-CM | POA: Diagnosis not present

## 2020-05-07 DIAGNOSIS — K861 Other chronic pancreatitis: Secondary | ICD-10-CM | POA: Diagnosis not present

## 2020-05-07 DIAGNOSIS — R1013 Epigastric pain: Secondary | ICD-10-CM | POA: Diagnosis not present

## 2020-05-08 DIAGNOSIS — E890 Postprocedural hypothyroidism: Secondary | ICD-10-CM | POA: Diagnosis not present

## 2020-05-08 DIAGNOSIS — R7309 Other abnormal glucose: Secondary | ICD-10-CM | POA: Diagnosis not present

## 2020-05-08 DIAGNOSIS — K861 Other chronic pancreatitis: Secondary | ICD-10-CM | POA: Diagnosis not present

## 2020-05-08 DIAGNOSIS — R7301 Impaired fasting glucose: Secondary | ICD-10-CM | POA: Diagnosis not present

## 2020-05-13 DIAGNOSIS — E0500 Thyrotoxicosis with diffuse goiter without thyrotoxic crisis or storm: Secondary | ICD-10-CM | POA: Diagnosis not present

## 2020-05-13 DIAGNOSIS — E890 Postprocedural hypothyroidism: Secondary | ICD-10-CM | POA: Diagnosis not present

## 2020-05-13 DIAGNOSIS — R7303 Prediabetes: Secondary | ICD-10-CM | POA: Diagnosis not present

## 2020-05-13 DIAGNOSIS — E041 Nontoxic single thyroid nodule: Secondary | ICD-10-CM | POA: Diagnosis not present

## 2020-05-15 DIAGNOSIS — R531 Weakness: Secondary | ICD-10-CM | POA: Diagnosis not present

## 2020-05-15 DIAGNOSIS — R079 Chest pain, unspecified: Secondary | ICD-10-CM | POA: Diagnosis not present

## 2020-05-15 DIAGNOSIS — E0500 Thyrotoxicosis with diffuse goiter without thyrotoxic crisis or storm: Secondary | ICD-10-CM | POA: Diagnosis not present

## 2020-05-15 DIAGNOSIS — R0602 Shortness of breath: Secondary | ICD-10-CM | POA: Diagnosis not present

## 2020-05-15 DIAGNOSIS — R634 Abnormal weight loss: Secondary | ICD-10-CM | POA: Diagnosis not present

## 2020-05-29 DIAGNOSIS — R634 Abnormal weight loss: Secondary | ICD-10-CM | POA: Diagnosis not present

## 2020-05-29 DIAGNOSIS — R079 Chest pain, unspecified: Secondary | ICD-10-CM | POA: Diagnosis not present

## 2020-05-29 DIAGNOSIS — I371 Nonrheumatic pulmonary valve insufficiency: Secondary | ICD-10-CM | POA: Diagnosis not present

## 2020-05-29 DIAGNOSIS — R531 Weakness: Secondary | ICD-10-CM | POA: Diagnosis not present

## 2020-05-29 DIAGNOSIS — I080 Rheumatic disorders of both mitral and aortic valves: Secondary | ICD-10-CM | POA: Diagnosis not present

## 2020-06-05 DIAGNOSIS — R634 Abnormal weight loss: Secondary | ICD-10-CM | POA: Diagnosis not present

## 2020-06-05 DIAGNOSIS — R079 Chest pain, unspecified: Secondary | ICD-10-CM | POA: Diagnosis not present

## 2020-06-05 DIAGNOSIS — E0500 Thyrotoxicosis with diffuse goiter without thyrotoxic crisis or storm: Secondary | ICD-10-CM | POA: Diagnosis not present

## 2020-06-24 DIAGNOSIS — E890 Postprocedural hypothyroidism: Secondary | ICD-10-CM | POA: Diagnosis not present

## 2020-06-25 DIAGNOSIS — R140 Abdominal distension (gaseous): Secondary | ICD-10-CM | POA: Diagnosis not present

## 2020-06-25 DIAGNOSIS — R634 Abnormal weight loss: Secondary | ICD-10-CM | POA: Diagnosis not present

## 2020-06-25 DIAGNOSIS — R143 Flatulence: Secondary | ICD-10-CM | POA: Diagnosis not present

## 2020-06-25 DIAGNOSIS — R1013 Epigastric pain: Secondary | ICD-10-CM | POA: Diagnosis not present

## 2020-07-10 DIAGNOSIS — H35372 Puckering of macula, left eye: Secondary | ICD-10-CM | POA: Diagnosis not present

## 2020-07-10 DIAGNOSIS — H35371 Puckering of macula, right eye: Secondary | ICD-10-CM | POA: Diagnosis not present

## 2020-07-10 DIAGNOSIS — H2513 Age-related nuclear cataract, bilateral: Secondary | ICD-10-CM | POA: Diagnosis not present

## 2020-08-18 DIAGNOSIS — E041 Nontoxic single thyroid nodule: Secondary | ICD-10-CM | POA: Diagnosis not present

## 2020-08-18 DIAGNOSIS — R7303 Prediabetes: Secondary | ICD-10-CM | POA: Diagnosis not present

## 2020-08-18 DIAGNOSIS — E0500 Thyrotoxicosis with diffuse goiter without thyrotoxic crisis or storm: Secondary | ICD-10-CM | POA: Diagnosis not present

## 2020-08-18 DIAGNOSIS — E890 Postprocedural hypothyroidism: Secondary | ICD-10-CM | POA: Diagnosis not present

## 2020-08-19 DIAGNOSIS — E0500 Thyrotoxicosis with diffuse goiter without thyrotoxic crisis or storm: Secondary | ICD-10-CM | POA: Diagnosis not present

## 2020-08-19 DIAGNOSIS — E890 Postprocedural hypothyroidism: Secondary | ICD-10-CM | POA: Diagnosis not present

## 2020-08-19 DIAGNOSIS — R000 Tachycardia, unspecified: Secondary | ICD-10-CM | POA: Diagnosis not present

## 2020-08-19 DIAGNOSIS — I959 Hypotension, unspecified: Secondary | ICD-10-CM | POA: Diagnosis not present

## 2020-08-28 DIAGNOSIS — R634 Abnormal weight loss: Secondary | ICD-10-CM | POA: Diagnosis not present

## 2020-08-28 DIAGNOSIS — R7301 Impaired fasting glucose: Secondary | ICD-10-CM | POA: Diagnosis not present

## 2020-08-28 DIAGNOSIS — Q453 Other congenital malformations of pancreas and pancreatic duct: Secondary | ICD-10-CM | POA: Diagnosis not present

## 2020-09-18 DIAGNOSIS — K861 Other chronic pancreatitis: Secondary | ICD-10-CM | POA: Diagnosis not present

## 2020-09-18 DIAGNOSIS — R634 Abnormal weight loss: Secondary | ICD-10-CM | POA: Diagnosis not present

## 2020-09-18 DIAGNOSIS — R5383 Other fatigue: Secondary | ICD-10-CM | POA: Diagnosis not present

## 2020-09-18 DIAGNOSIS — R7301 Impaired fasting glucose: Secondary | ICD-10-CM | POA: Diagnosis not present

## 2020-09-24 DIAGNOSIS — K861 Other chronic pancreatitis: Secondary | ICD-10-CM | POA: Diagnosis not present

## 2020-09-24 DIAGNOSIS — Z1211 Encounter for screening for malignant neoplasm of colon: Secondary | ICD-10-CM | POA: Diagnosis not present

## 2020-09-24 DIAGNOSIS — R1013 Epigastric pain: Secondary | ICD-10-CM | POA: Diagnosis not present

## 2020-09-29 ENCOUNTER — Other Ambulatory Visit: Payer: Self-pay | Admitting: Internal Medicine

## 2020-09-29 DIAGNOSIS — R1030 Lower abdominal pain, unspecified: Secondary | ICD-10-CM

## 2020-10-14 ENCOUNTER — Other Ambulatory Visit: Payer: Self-pay

## 2020-10-14 ENCOUNTER — Ambulatory Visit
Admission: RE | Admit: 2020-10-14 | Discharge: 2020-10-14 | Disposition: A | Discharge status: Home / Self Care | Payer: BC Managed Care – PPO | Source: Ambulatory Visit | Attending: Internal Medicine | Admitting: Internal Medicine

## 2020-10-14 DIAGNOSIS — M47817 Spondylosis without myelopathy or radiculopathy, lumbosacral region: Secondary | ICD-10-CM | POA: Diagnosis not present

## 2020-10-14 DIAGNOSIS — R102 Pelvic and perineal pain: Secondary | ICD-10-CM | POA: Diagnosis not present

## 2020-10-14 DIAGNOSIS — R1030 Lower abdominal pain, unspecified: Secondary | ICD-10-CM

## 2020-10-14 DIAGNOSIS — M47816 Spondylosis without myelopathy or radiculopathy, lumbar region: Secondary | ICD-10-CM | POA: Diagnosis not present

## 2020-10-14 MED ORDER — IOPAMIDOL (ISOVUE-300) INJECTION 61%
100.0000 mL | Freq: Once | INTRAVENOUS | Status: AC | PRN
  Administered 2020-10-14: 100 mL via INTRAVENOUS

## 2020-10-30 DIAGNOSIS — D125 Benign neoplasm of sigmoid colon: Secondary | ICD-10-CM | POA: Diagnosis not present

## 2020-10-30 DIAGNOSIS — R1013 Epigastric pain: Secondary | ICD-10-CM | POA: Diagnosis not present

## 2020-10-30 DIAGNOSIS — K2270 Barrett's esophagus without dysplasia: Secondary | ICD-10-CM | POA: Diagnosis not present

## 2020-10-30 DIAGNOSIS — K861 Other chronic pancreatitis: Secondary | ICD-10-CM | POA: Diagnosis not present

## 2020-10-30 DIAGNOSIS — K259 Gastric ulcer, unspecified as acute or chronic, without hemorrhage or perforation: Secondary | ICD-10-CM | POA: Diagnosis not present

## 2020-10-30 DIAGNOSIS — Z8711 Personal history of peptic ulcer disease: Secondary | ICD-10-CM | POA: Diagnosis not present

## 2020-10-30 DIAGNOSIS — K317 Polyp of stomach and duodenum: Secondary | ICD-10-CM | POA: Diagnosis not present

## 2020-10-30 DIAGNOSIS — Z1211 Encounter for screening for malignant neoplasm of colon: Secondary | ICD-10-CM | POA: Diagnosis not present

## 2020-10-30 DIAGNOSIS — K635 Polyp of colon: Secondary | ICD-10-CM | POA: Diagnosis not present

## 2020-11-10 DIAGNOSIS — E890 Postprocedural hypothyroidism: Secondary | ICD-10-CM | POA: Diagnosis not present

## 2020-11-10 DIAGNOSIS — R7303 Prediabetes: Secondary | ICD-10-CM | POA: Diagnosis not present

## 2020-11-17 DIAGNOSIS — E890 Postprocedural hypothyroidism: Secondary | ICD-10-CM | POA: Diagnosis not present

## 2020-11-17 DIAGNOSIS — R7303 Prediabetes: Secondary | ICD-10-CM | POA: Diagnosis not present

## 2020-11-17 DIAGNOSIS — E041 Nontoxic single thyroid nodule: Secondary | ICD-10-CM | POA: Diagnosis not present

## 2020-11-17 DIAGNOSIS — E0500 Thyrotoxicosis with diffuse goiter without thyrotoxic crisis or storm: Secondary | ICD-10-CM | POA: Diagnosis not present

## 2020-12-31 DIAGNOSIS — K861 Other chronic pancreatitis: Secondary | ICD-10-CM | POA: Diagnosis not present

## 2020-12-31 DIAGNOSIS — K317 Polyp of stomach and duodenum: Secondary | ICD-10-CM | POA: Diagnosis not present

## 2021-02-19 DIAGNOSIS — K2970 Gastritis, unspecified, without bleeding: Secondary | ICD-10-CM | POA: Diagnosis not present

## 2021-02-19 DIAGNOSIS — K317 Polyp of stomach and duodenum: Secondary | ICD-10-CM | POA: Diagnosis not present

## 2021-02-19 DIAGNOSIS — K31A19 Gastric intestinal metaplasia without dysplasia, unspecified site: Secondary | ICD-10-CM | POA: Diagnosis not present

## 2021-02-19 DIAGNOSIS — K2960 Other gastritis without bleeding: Secondary | ICD-10-CM | POA: Diagnosis not present

## 2021-02-19 DIAGNOSIS — K31A0 Gastric intestinal metaplasia, unspecified: Secondary | ICD-10-CM | POA: Diagnosis not present

## 2021-03-16 DIAGNOSIS — R7301 Impaired fasting glucose: Secondary | ICD-10-CM | POA: Diagnosis not present

## 2021-03-16 DIAGNOSIS — K861 Other chronic pancreatitis: Secondary | ICD-10-CM | POA: Diagnosis not present

## 2021-03-16 DIAGNOSIS — E7800 Pure hypercholesterolemia, unspecified: Secondary | ICD-10-CM | POA: Diagnosis not present

## 2021-03-16 DIAGNOSIS — Z0000 Encounter for general adult medical examination without abnormal findings: Secondary | ICD-10-CM | POA: Diagnosis not present

## 2021-03-16 DIAGNOSIS — Z23 Encounter for immunization: Secondary | ICD-10-CM | POA: Diagnosis not present

## 2021-07-06 IMAGING — US US ABDOMEN COMPLETE
1 series · 13 of 25 positions shown · non-contrast
Comparison: 07/07/2015

CLINICAL DATA: Weight loss

EXAM:
ABDOMEN ULTRASOUND COMPLETE

[Series 1: us abdomen complete · 0.17mm/px · 13 of 81 slices shown]
[im 1/81]
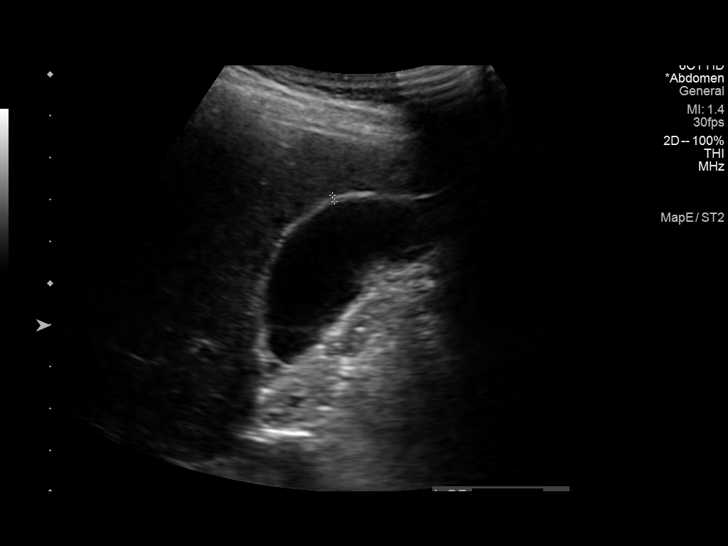
[im 7/81]
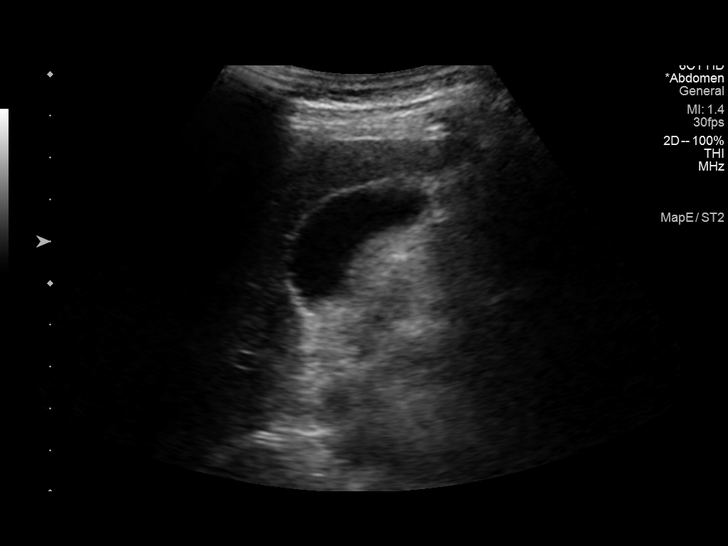
[im 14/81]
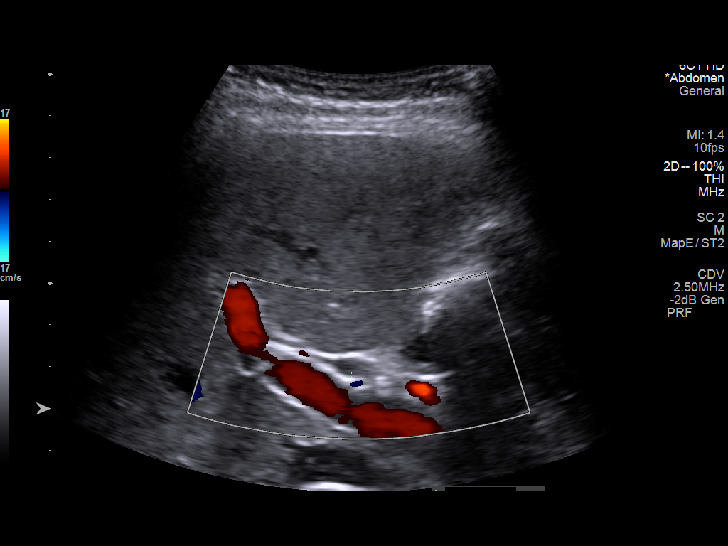
[im 21/81]
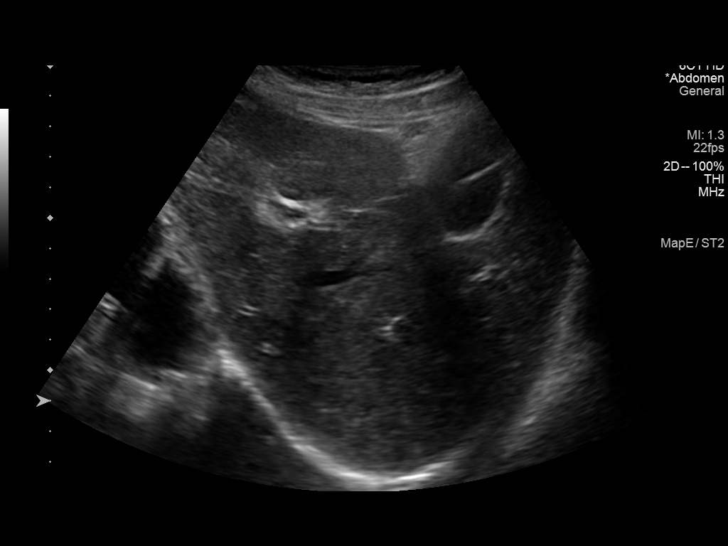
[im 27/81]
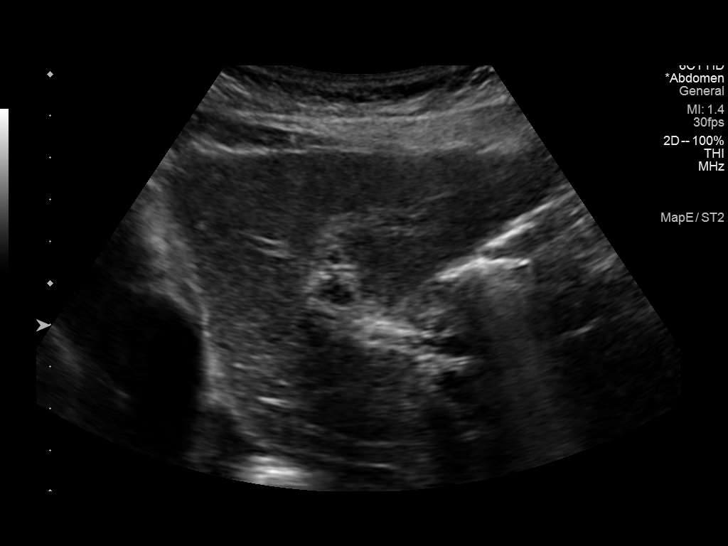
[im 34/81]
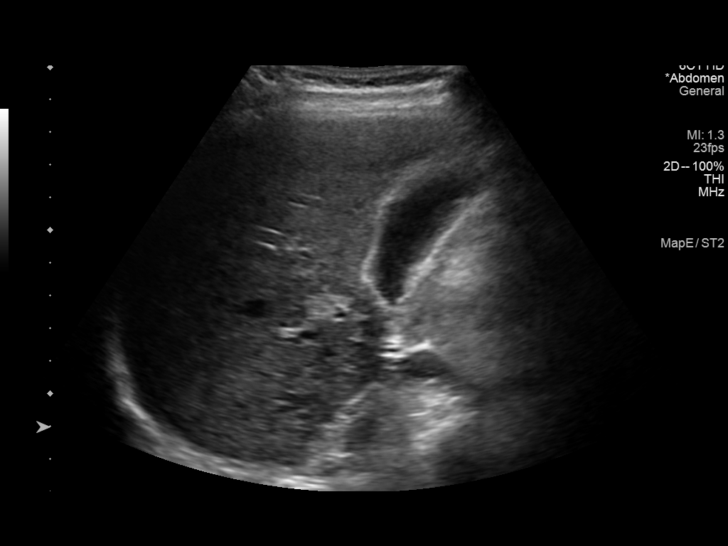
[im 41/81]
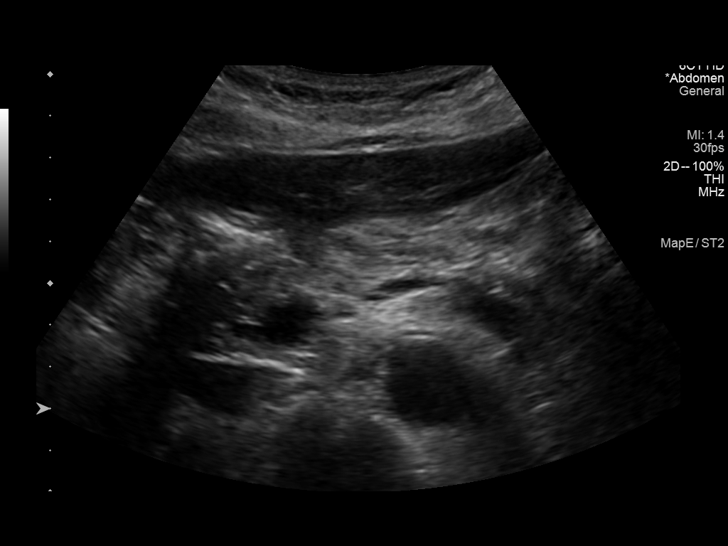
[im 47/81]
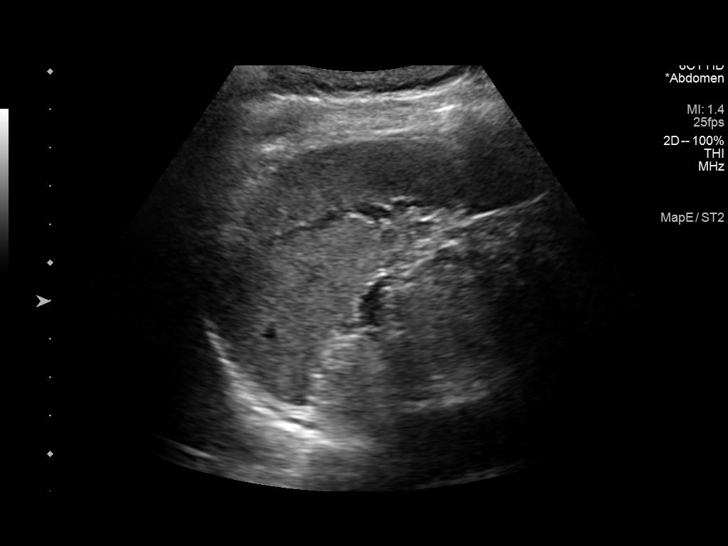
[im 54/81]
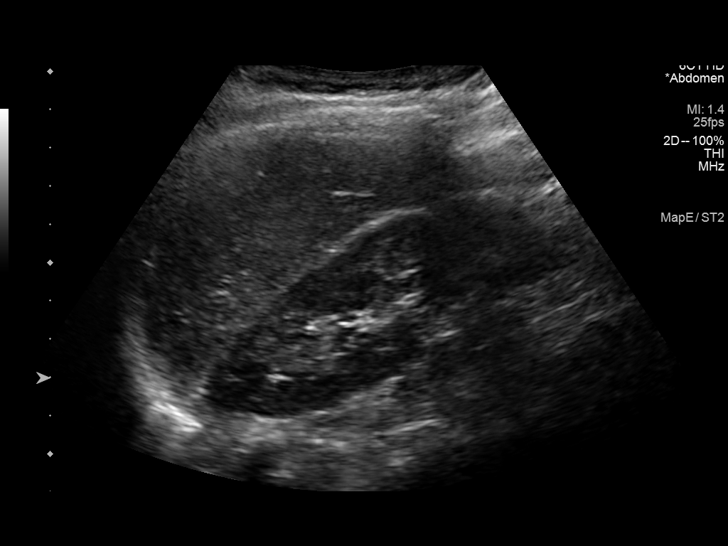
[im 61/81]
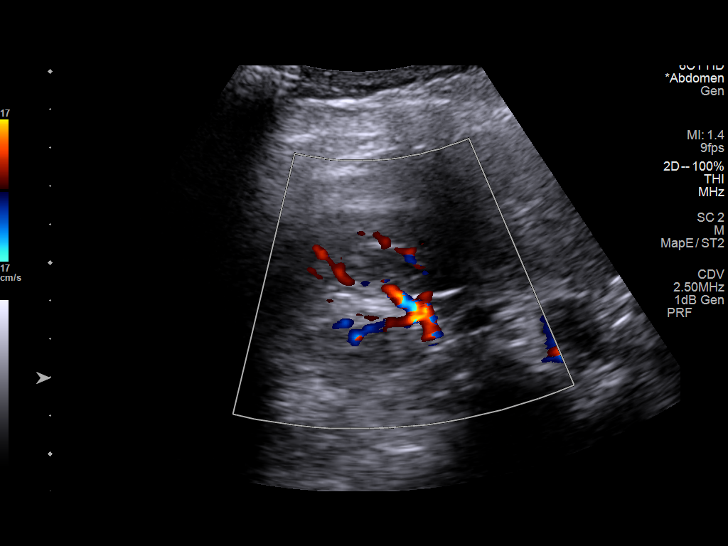
[im 67/81]
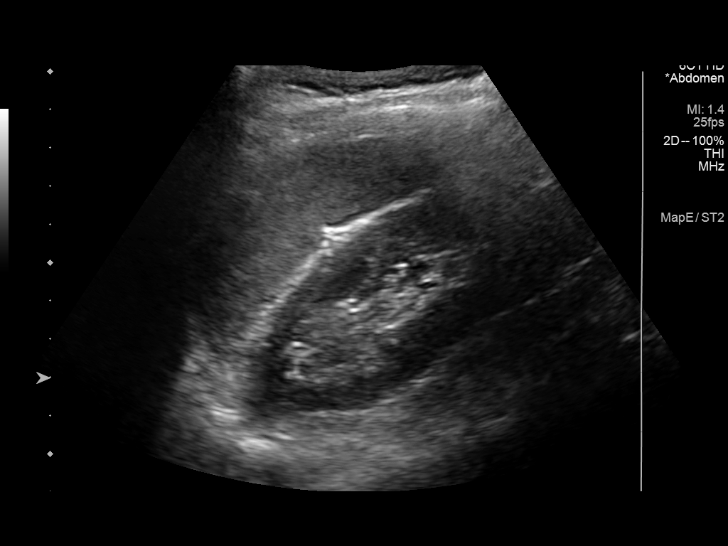
[im 74/81]
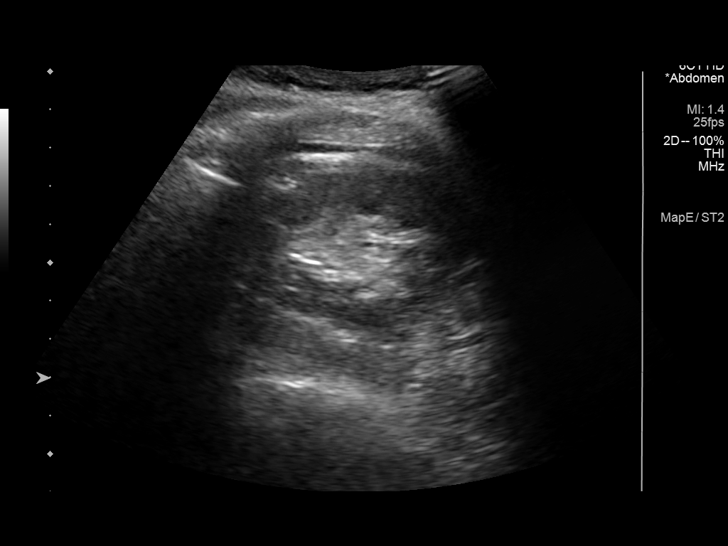
[im 81/81]
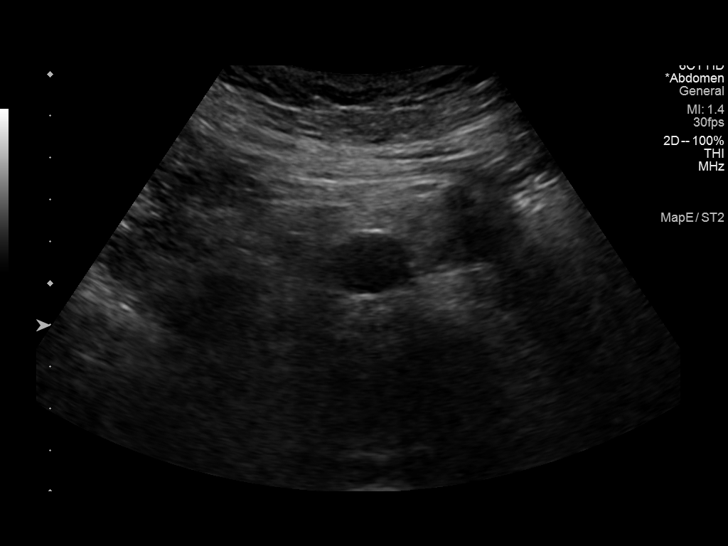

[13 of 25 positions shown; findings below may reference images not displayed]

FINDINGS: Gallbladder: No gallstones or wall thickening visualized. No
sonographic Murphy sign noted by sonographer.

Common bile duct: Diameter: 0.4 cm

Liver: No focal lesion identified. Within normal limits in
parenchymal echogenicity. Portal vein is patent on color Doppler
imaging with normal direction of blood flow towards the liver.

IVC: No abnormality visualized.

Pancreas: The pancreatic body appears somewhat heterogeneous for
example on image 42, although this is not appreciably changed from
07/07/2015. Pancreatic tail and head are poorly seen.

Spleen: Size and appearance within normal limits.

Right Kidney: Length: 11.3 cm. Echogenicity within normal limits. No
mass or hydronephrosis visualized.

Left Kidney: Length: 11.5 cm. Echogenicity within normal limits. No
mass or hydronephrosis visualized.

Abdominal aorta: No aneurysm visualized.

Other findings: None.
IMPRESSION: 1. Heterogeneous echogenicity in the visualized pancreatic
parenchyma of the pancreatic body. Correlate with lipase levels in
assessing for pancreatitis. However, similar heterogenicity was also
present on 07/07/2015, hence I am doubtful that this is represents a
malignant process. If further workup of the pancreas is warranted,
pancreatic protocol MRI with and without contrast could be utilized.
2. Otherwise unremarkable exam.

## 2022-03-10 DIAGNOSIS — E890 Postprocedural hypothyroidism: Secondary | ICD-10-CM | POA: Diagnosis not present

## 2022-03-10 DIAGNOSIS — E0500 Thyrotoxicosis with diffuse goiter without thyrotoxic crisis or storm: Secondary | ICD-10-CM | POA: Diagnosis not present

## 2022-03-10 DIAGNOSIS — E041 Nontoxic single thyroid nodule: Secondary | ICD-10-CM | POA: Diagnosis not present

## 2022-03-10 DIAGNOSIS — R7303 Prediabetes: Secondary | ICD-10-CM | POA: Diagnosis not present

## 2022-03-15 DIAGNOSIS — Z0000 Encounter for general adult medical examination without abnormal findings: Secondary | ICD-10-CM | POA: Diagnosis not present

## 2022-03-15 DIAGNOSIS — K861 Other chronic pancreatitis: Secondary | ICD-10-CM | POA: Diagnosis not present

## 2022-03-15 DIAGNOSIS — E890 Postprocedural hypothyroidism: Secondary | ICD-10-CM | POA: Diagnosis not present

## 2022-03-15 DIAGNOSIS — R1011 Right upper quadrant pain: Secondary | ICD-10-CM | POA: Diagnosis not present

## 2022-03-16 DIAGNOSIS — E7800 Pure hypercholesterolemia, unspecified: Secondary | ICD-10-CM | POA: Diagnosis not present

## 2022-03-16 DIAGNOSIS — Z13220 Encounter for screening for lipoid disorders: Secondary | ICD-10-CM | POA: Diagnosis not present

## 2022-03-16 DIAGNOSIS — Z130 Encounter for screening for diseases of the blood and blood-forming organs and certain disorders involving the immune mechanism: Secondary | ICD-10-CM | POA: Diagnosis not present

## 2022-03-16 DIAGNOSIS — Z0000 Encounter for general adult medical examination without abnormal findings: Secondary | ICD-10-CM | POA: Diagnosis not present

## 2022-03-16 DIAGNOSIS — M8589 Other specified disorders of bone density and structure, multiple sites: Secondary | ICD-10-CM | POA: Diagnosis not present

## 2022-03-16 DIAGNOSIS — Z13228 Encounter for screening for other metabolic disorders: Secondary | ICD-10-CM | POA: Diagnosis not present

## 2022-03-17 ENCOUNTER — Other Ambulatory Visit: Payer: Self-pay | Admitting: Internal Medicine

## 2022-03-17 DIAGNOSIS — R7303 Prediabetes: Secondary | ICD-10-CM | POA: Diagnosis not present

## 2022-03-17 DIAGNOSIS — R634 Abnormal weight loss: Secondary | ICD-10-CM | POA: Diagnosis not present

## 2022-03-17 DIAGNOSIS — E041 Nontoxic single thyroid nodule: Secondary | ICD-10-CM | POA: Diagnosis not present

## 2022-03-17 DIAGNOSIS — M8589 Other specified disorders of bone density and structure, multiple sites: Secondary | ICD-10-CM | POA: Diagnosis not present

## 2022-03-17 DIAGNOSIS — Z780 Asymptomatic menopausal state: Secondary | ICD-10-CM | POA: Diagnosis not present

## 2022-03-17 DIAGNOSIS — E0500 Thyrotoxicosis with diffuse goiter without thyrotoxic crisis or storm: Secondary | ICD-10-CM | POA: Diagnosis not present

## 2022-03-17 DIAGNOSIS — E890 Postprocedural hypothyroidism: Secondary | ICD-10-CM | POA: Diagnosis not present

## 2022-03-17 DIAGNOSIS — Z1231 Encounter for screening mammogram for malignant neoplasm of breast: Secondary | ICD-10-CM | POA: Diagnosis not present

## 2022-03-24 ENCOUNTER — Ambulatory Visit
Admission: RE | Admit: 2022-03-24 | Discharge: 2022-03-24 | Disposition: A | Discharge status: Home / Self Care | Payer: BC Managed Care – PPO | Source: Ambulatory Visit | Attending: Internal Medicine | Admitting: Internal Medicine

## 2022-03-24 DIAGNOSIS — E041 Nontoxic single thyroid nodule: Secondary | ICD-10-CM

## 2022-04-06 DIAGNOSIS — R109 Unspecified abdominal pain: Secondary | ICD-10-CM | POA: Diagnosis not present

## 2022-04-06 DIAGNOSIS — K861 Other chronic pancreatitis: Secondary | ICD-10-CM | POA: Diagnosis not present

## 2022-04-06 DIAGNOSIS — R1030 Lower abdominal pain, unspecified: Secondary | ICD-10-CM | POA: Diagnosis not present

## 2022-04-06 DIAGNOSIS — R195 Other fecal abnormalities: Secondary | ICD-10-CM | POA: Diagnosis not present

## 2022-04-06 DIAGNOSIS — R140 Abdominal distension (gaseous): Secondary | ICD-10-CM | POA: Diagnosis not present

## 2022-11-15 DIAGNOSIS — R140 Abdominal distension (gaseous): Secondary | ICD-10-CM | POA: Diagnosis not present

## 2022-11-15 DIAGNOSIS — Z86010 Personal history of colonic polyps: Secondary | ICD-10-CM | POA: Diagnosis not present

## 2022-11-15 DIAGNOSIS — K861 Other chronic pancreatitis: Secondary | ICD-10-CM | POA: Diagnosis not present

## 2022-12-05 IMAGING — CT CT PELVIS W/ CM
2 series · 13 of 32 positions shown, 19 images · IV contrast (APPLIED)
Comparison: 05/16/2019

CLINICAL DATA: Lower pelvic pain

EXAM:
CT PELVIS WITH CONTRAST
TECHNIQUE: Multidetector CT imaging of the pelvis was performed using the
standard protocol following the bolus administration of intravenous
contrast.
CONTRAST:  100mL A10QR5-STT IOPAMIDOL (A10QR5-STT) INJECTION 61%

[Series 2: routine pelvis w/cm · axial · 0.75mm/px · z∈[-335,-125]mm · 10 of 52 slices shown, 16 images]
[im 5/52  soft-tissue]
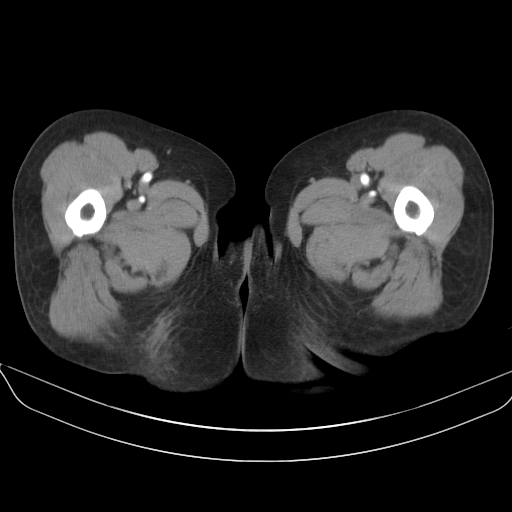
[im 5/52  bone]
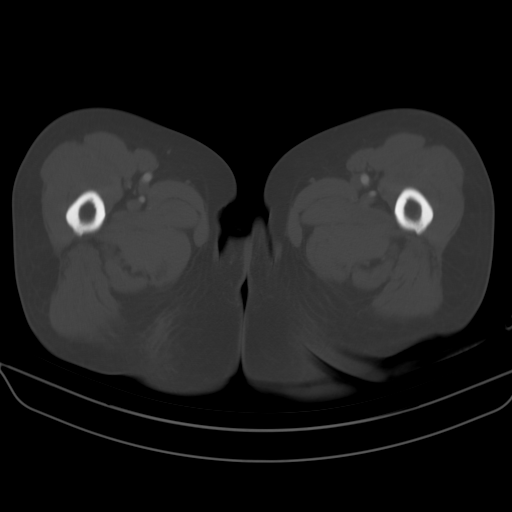
[im 10/52  soft-tissue]
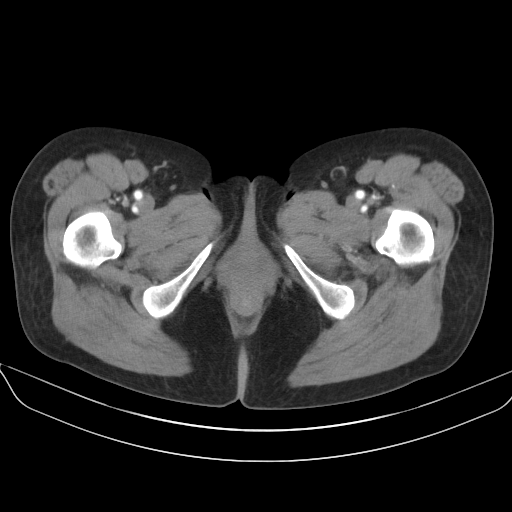
[im 14/52  soft-tissue]
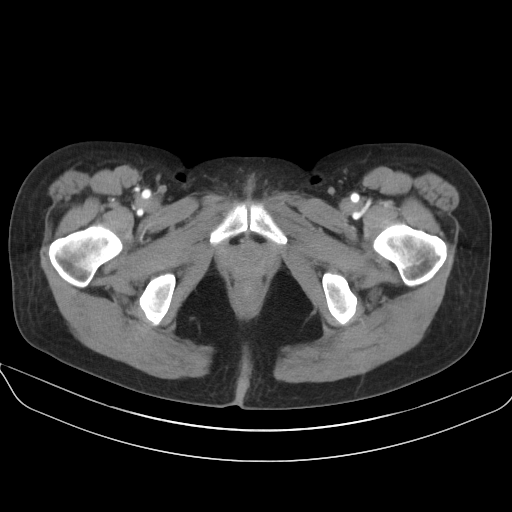
[im 19/52  soft-tissue]
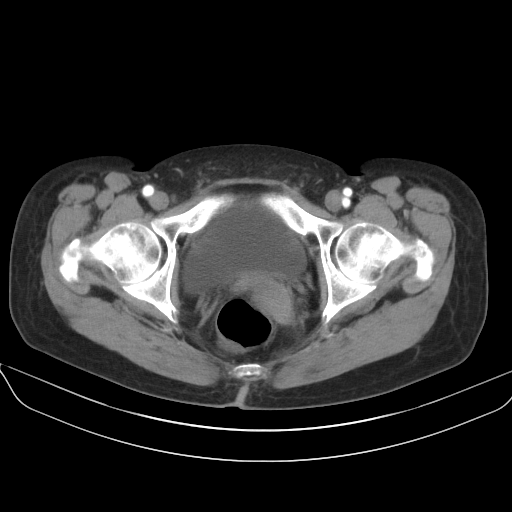
[im 24/52  soft-tissue]
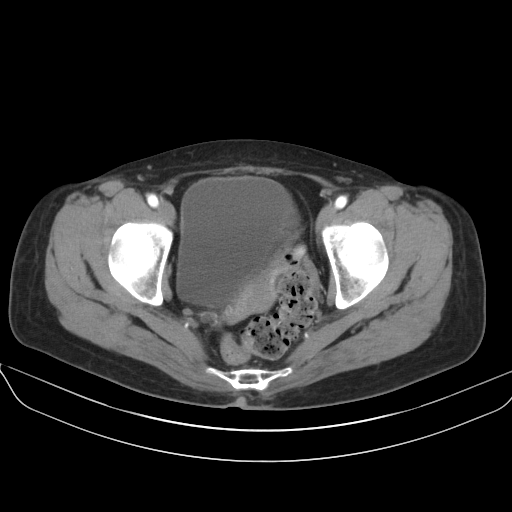
[im 28/52  soft-tissue]
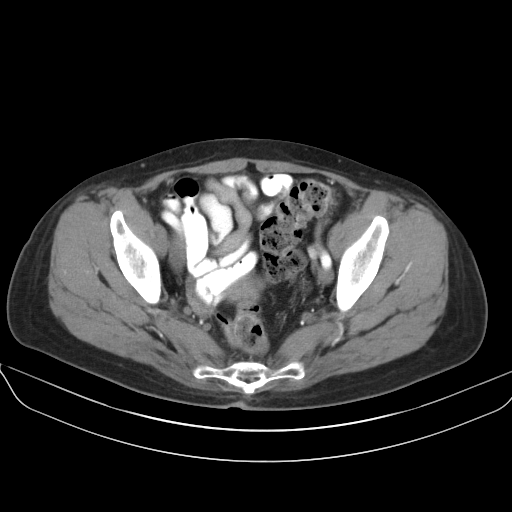
[im 33/52  soft-tissue]
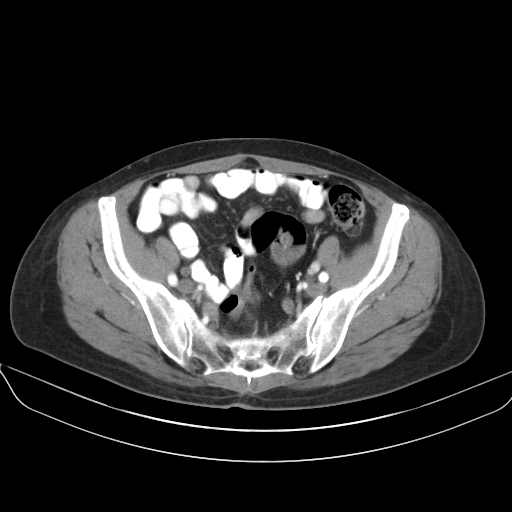
[im 33/52  lung]
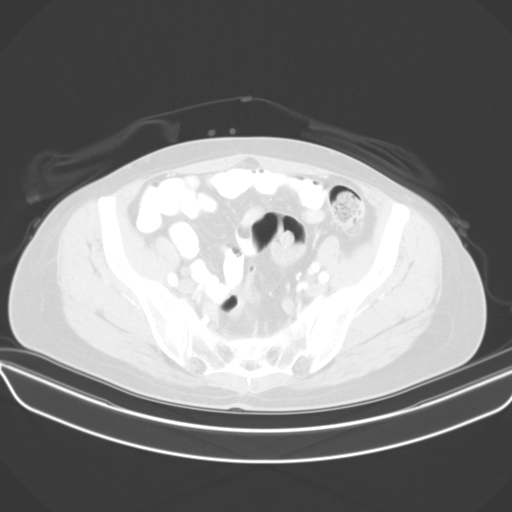
[im 38/52  soft-tissue]
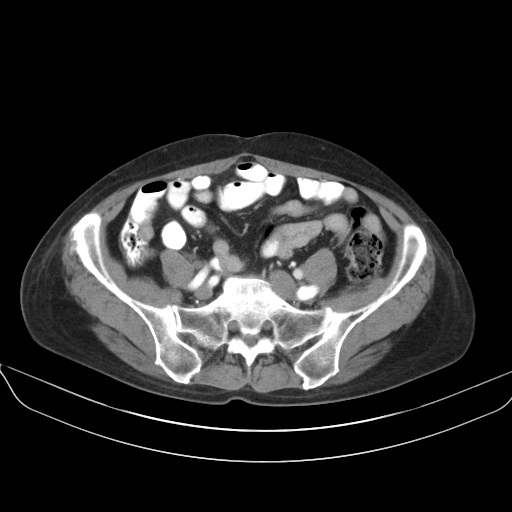
[im 38/52  lung]
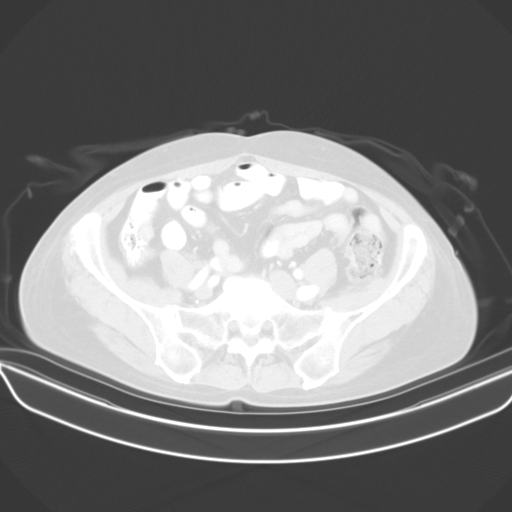
[im 42/52  soft-tissue]
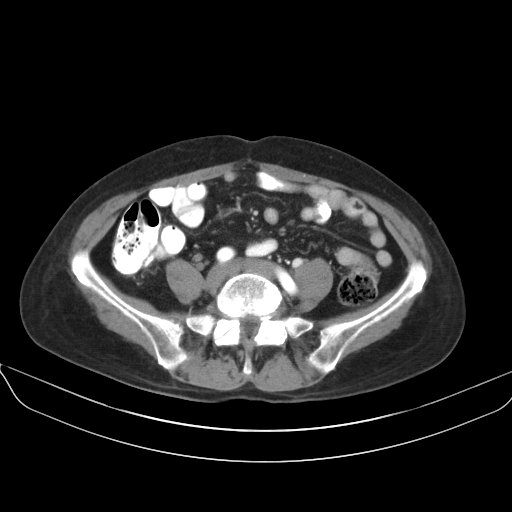
[im 42/52  lung]
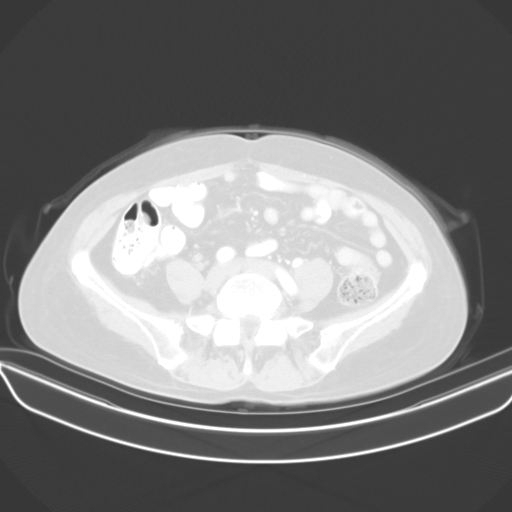
[im 42/52  bone]
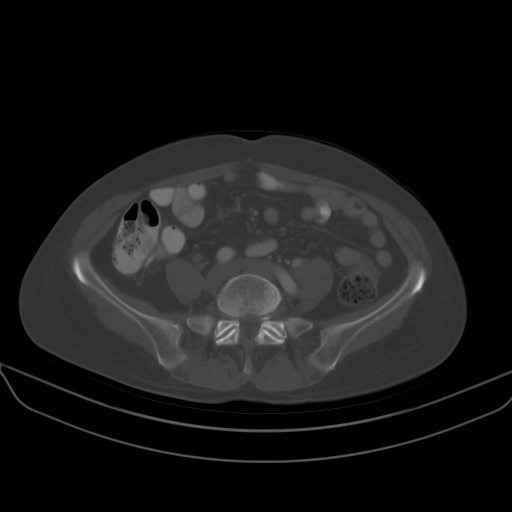
[im 47/52  soft-tissue]
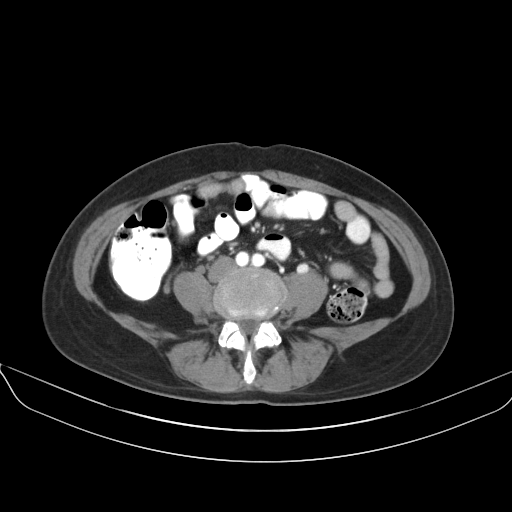
[im 47/52  lung]
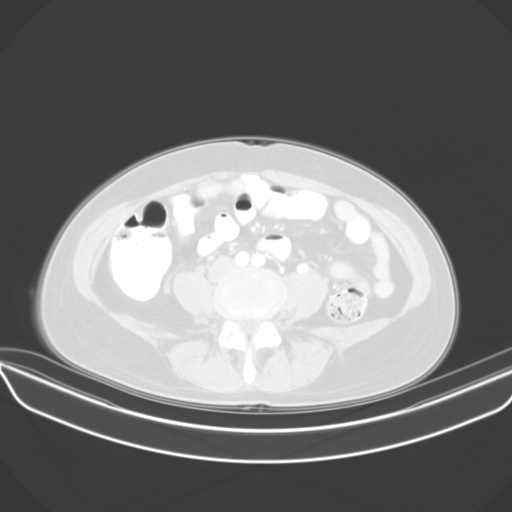

[Series 3: bone · axial · 0.75mm/px · z∈[-331,-277]mm · 3 of 87 slices shown]
[im 10/87  bone]
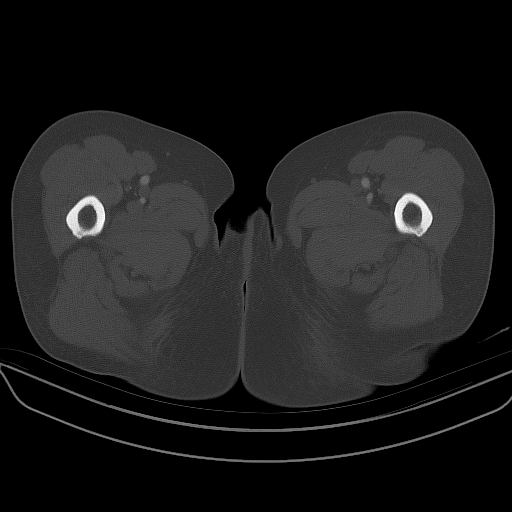
[im 19/87  bone]
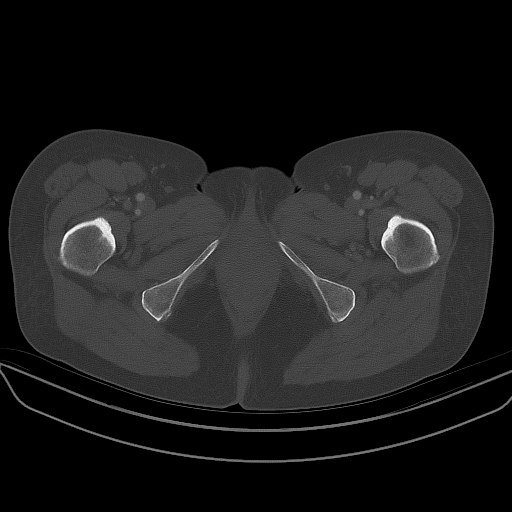
[im 28/87  bone]
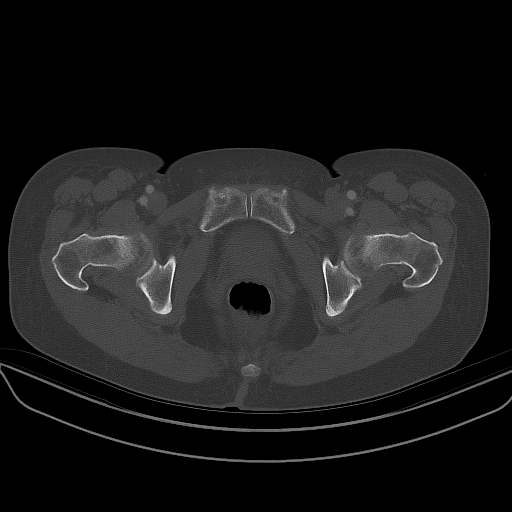

[13 of 32 positions shown; findings below may reference images not displayed]

FINDINGS: Urinary Tract:  Distal ureters and bladder are unremarkable.

Bowel: No bowel obstruction or ileus. Normal appendix right lower
quadrant. No bowel wall thickening or inflammatory change.

Vascular/Lymphatic: No pathologically enlarged lymph nodes. No
significant vascular abnormality seen.

Reproductive:  Uterus and adnexal structures are unremarkable.

Other:  No free fluid or free gas.  No abdominal wall hernia.

Musculoskeletal: No acute or destructive bony lesions. Reconstructed
images demonstrate moderate spondylosis at L5/S1 with central canal
and bilateral neural foraminal encroachment.
IMPRESSION: 1. No acute intrapelvic process.
2. Prominent spondylosis at L5/S1.

## 2023-04-11 DIAGNOSIS — E890 Postprocedural hypothyroidism: Secondary | ICD-10-CM | POA: Diagnosis not present

## 2023-04-11 DIAGNOSIS — E0500 Thyrotoxicosis with diffuse goiter without thyrotoxic crisis or storm: Secondary | ICD-10-CM | POA: Diagnosis not present

## 2023-04-11 DIAGNOSIS — E041 Nontoxic single thyroid nodule: Secondary | ICD-10-CM | POA: Diagnosis not present

## 2023-04-11 DIAGNOSIS — R7303 Prediabetes: Secondary | ICD-10-CM | POA: Diagnosis not present

## 2023-04-12 DIAGNOSIS — E041 Nontoxic single thyroid nodule: Secondary | ICD-10-CM | POA: Diagnosis not present

## 2023-04-12 DIAGNOSIS — R7303 Prediabetes: Secondary | ICD-10-CM | POA: Diagnosis not present

## 2023-04-12 DIAGNOSIS — E0500 Thyrotoxicosis with diffuse goiter without thyrotoxic crisis or storm: Secondary | ICD-10-CM | POA: Diagnosis not present

## 2023-04-12 DIAGNOSIS — E890 Postprocedural hypothyroidism: Secondary | ICD-10-CM | POA: Diagnosis not present

## 2023-04-20 DIAGNOSIS — K638219 Small intestinal bacterial overgrowth, unspecified: Secondary | ICD-10-CM | POA: Diagnosis not present

## 2023-04-20 DIAGNOSIS — R140 Abdominal distension (gaseous): Secondary | ICD-10-CM | POA: Diagnosis not present

## 2023-04-20 DIAGNOSIS — R634 Abnormal weight loss: Secondary | ICD-10-CM | POA: Diagnosis not present

## 2023-04-20 DIAGNOSIS — R1084 Generalized abdominal pain: Secondary | ICD-10-CM | POA: Diagnosis not present

## 2023-04-29 DIAGNOSIS — M8580 Other specified disorders of bone density and structure, unspecified site: Secondary | ICD-10-CM | POA: Diagnosis not present

## 2023-04-29 DIAGNOSIS — Z0000 Encounter for general adult medical examination without abnormal findings: Secondary | ICD-10-CM | POA: Diagnosis not present

## 2023-04-29 DIAGNOSIS — F4321 Adjustment disorder with depressed mood: Secondary | ICD-10-CM | POA: Diagnosis not present

## 2023-04-29 DIAGNOSIS — E7800 Pure hypercholesterolemia, unspecified: Secondary | ICD-10-CM | POA: Diagnosis not present

## 2023-04-29 DIAGNOSIS — Z23 Encounter for immunization: Secondary | ICD-10-CM | POA: Diagnosis not present

## 2023-05-10 ENCOUNTER — Encounter: Payer: Self-pay | Admitting: Gastroenterology

## 2023-05-10 ENCOUNTER — Other Ambulatory Visit: Payer: Self-pay | Admitting: Gastroenterology

## 2023-05-10 DIAGNOSIS — Z1231 Encounter for screening mammogram for malignant neoplasm of breast: Secondary | ICD-10-CM | POA: Diagnosis not present

## 2023-05-10 DIAGNOSIS — K861 Other chronic pancreatitis: Secondary | ICD-10-CM

## 2023-05-10 DIAGNOSIS — R1013 Epigastric pain: Secondary | ICD-10-CM | POA: Diagnosis not present

## 2023-05-10 DIAGNOSIS — R92333 Mammographic heterogeneous density, bilateral breasts: Secondary | ICD-10-CM | POA: Diagnosis not present

## 2023-05-10 DIAGNOSIS — R109 Unspecified abdominal pain: Secondary | ICD-10-CM

## 2023-05-11 ENCOUNTER — Ambulatory Visit
Admission: RE | Admit: 2023-05-11 | Discharge: 2023-05-11 | Disposition: A | Discharge status: Home / Self Care | Payer: BC Managed Care – PPO | Source: Ambulatory Visit | Attending: Gastroenterology | Admitting: Gastroenterology

## 2023-05-11 DIAGNOSIS — K861 Other chronic pancreatitis: Secondary | ICD-10-CM

## 2023-05-11 DIAGNOSIS — I700 Atherosclerosis of aorta: Secondary | ICD-10-CM | POA: Diagnosis not present

## 2023-05-11 DIAGNOSIS — R109 Unspecified abdominal pain: Secondary | ICD-10-CM

## 2023-05-11 MED ORDER — GADOPICLENOL 0.5 MMOL/ML IV SOLN
5.0000 mL | Freq: Once | INTRAVENOUS | Status: AC | PRN
  Administered 2023-05-11: 5 mL via INTRAVENOUS

## 2023-05-18 ENCOUNTER — Other Ambulatory Visit: Payer: Self-pay | Admitting: Gastroenterology

## 2023-05-18 DIAGNOSIS — K861 Other chronic pancreatitis: Secondary | ICD-10-CM

## 2023-05-19 ENCOUNTER — Ambulatory Visit
Admission: RE | Admit: 2023-05-19 | Discharge: 2023-05-19 | Disposition: A | Discharge status: Home / Self Care | Payer: Medicare Other | Source: Ambulatory Visit | Attending: Gastroenterology | Admitting: Gastroenterology

## 2023-05-19 DIAGNOSIS — K861 Other chronic pancreatitis: Secondary | ICD-10-CM

## 2023-05-30 ENCOUNTER — Encounter: Payer: Self-pay | Admitting: Nurse Practitioner

## 2023-05-30 DIAGNOSIS — R7303 Prediabetes: Secondary | ICD-10-CM | POA: Diagnosis not present

## 2023-05-31 ENCOUNTER — Encounter: Payer: Self-pay | Admitting: Internal Medicine

## 2023-07-15 ENCOUNTER — Other Ambulatory Visit (INDEPENDENT_AMBULATORY_CARE_PROVIDER_SITE_OTHER): Payer: Medicare Other

## 2023-07-15 ENCOUNTER — Ambulatory Visit: Payer: Medicare Other | Admitting: Orthopaedic Surgery

## 2023-07-15 DIAGNOSIS — M25552 Pain in left hip: Secondary | ICD-10-CM

## 2023-07-15 NOTE — Progress Notes (Signed)
Office Visit Note   Patient: Erin Sims           Date of Birth: 01/10/1956           MRN: [id number] Visit Date: 07/15/2023              Requested by: No referring provider defined for this encounter. PCP: Kirby Funk, MD (Inactive)   Assessment & Plan: Visit Diagnoses:  1. Pain in left hip     Plan: Impression is left hip.  Based on the patient's history and clinical exam, it is somewhat hard to distinguish where her pain is coming from although I feel this could be referred from her back.  It does sound like it could be muscular as well.  We have discussed referral to outpatient physical therapy for which she is agreeable to.  Referral has been made.  She will follow-up with [place] as needed.  Entire encounter was discussed through a mandrin interpreter.  Follow-Up Instructions: Return if symptoms worsen or fail to improve.   Orders:  Orders Placed This Encounter  Procedures   XR HIP UNILAT W OR W/O PELVIS 2-3 VIEWS LEFT   Ambulatory referral to Physical Therapy   No orders of the defined types were placed in this encounter.     Procedures: No procedures performed   Clinical Data: No additional findings.   Subjective: Chief Complaint  Patient presents with   Left Hip - Pain    HPI patient is a pleasant 67 year old female who comes in today with left hip pain for the past few months.  She denies any injury or change in activity.  The pain she has is to the lateral aspect.  No pain into the groin, anterior thigh, back, buttock or radiation down the leg.  Symptoms are worse when she is lying supine or when she rolls over from her right to the left side but no actual pain when she is lying on the left side.  She has slight pain when she is walking in place.  She does not take medication for this.  She denies any paresthesias to the left lower extremity.  No bowel or bladder change.  Review of Systems as detailed in HPI.  All others reviewed and are  negative.   Objective: Vital Signs: There were no vitals taken for this visit.  Physical Exam well-developed well-nourished female no acute distress.  Alert and oriented x 3.  Ortho Exam left hip exam: No pain with logroll, FADIR or Stinchfield testing.  She does have tenderness superior to the greater troches.  No tenderness to the actual greater trochanter.  No tenderness to the piriformis.  Negative straight leg raise.  Unremarkable lumbar spine exam.  Specialty Comments:  No specialty comments available.  Imaging: XR HIP UNILAT W OR W/O PELVIS 2-3 VIEWS LEFT  Result Date: 07/15/2023 X-rays of the left hip show no acute or structural abnormalities    PMFS History: Patient Active Problem List   Diagnosis Date Noted   Hypothyroidism 06/20/2007   GRAVE'S DISEASE, HX OF 06/20/2007   Past Medical History:  Diagnosis Date   Abdominal discomfort    History of Graves' disease    "radaition therapy 2010 to kill thyroid function", now takes supplement" "Synthroid orally only"- Dr. Sharl Ma follows    No family history on file.  Past Surgical History:  Procedure Laterality Date   CESAREAN SECTION     x 1 '82 "[place]"   UPPER GI ENDOSCOPY  done in [place] "? some ? polyp"   Social History   Occupational History   Not on file  Tobacco Use   Smoking status: Never   Smokeless tobacco: Never   Tobacco comments:    Exposed to second hand smoke in ealy years  Substance and Sexual Activity   Alcohol use: No   Drug use: No   Sexual activity: Yes

## 2023-07-22 ENCOUNTER — Encounter: Payer: Self-pay | Admitting: Physical Therapy

## 2023-07-22 ENCOUNTER — Ambulatory Visit: Payer: Medicare Other | Attending: Physician Assistant | Admitting: Physical Therapy

## 2023-07-22 DIAGNOSIS — M6281 Muscle weakness (generalized): Secondary | ICD-10-CM | POA: Insufficient documentation

## 2023-07-22 DIAGNOSIS — M25552 Pain in left hip: Secondary | ICD-10-CM | POA: Insufficient documentation

## 2023-07-22 DIAGNOSIS — R262 Difficulty in walking, not elsewhere classified: Secondary | ICD-10-CM | POA: Insufficient documentation

## 2023-07-22 NOTE — Therapy (Signed)
OUTPATIENT PHYSICAL THERAPY LOWER EXTREMITY EVALUATION   Patient Name: Erin Sims MRN: [id number] DOB:1956-05-06, 67 y.o., female Today's Date: 07/22/2023  END OF SESSION:  PT End of Session - 07/22/23 1020     Visit Number 1    Date for PT Re-Evaluation 09/21/23    Authorization Type [employer] [facility]    PT Start Time 1015    PT Stop Time 1100    PT Time Calculation (min) 45 min    Activity Tolerance Patient tolerated treatment well    Behavior During Therapy WFL for tasks assessed/performed             Past Medical History:  Diagnosis Date   Abdominal discomfort    History of Graves' disease    "radaition therapy 2010 to kill thyroid function", now takes supplement" "Synthroid orally only"- Dr. Sharl Ma follows   Past Surgical History:  Procedure Laterality Date   CESAREAN SECTION     x 1 '82 "[place]"   UPPER GI ENDOSCOPY     done in [place] "? some ? polyp"   Patient Active Problem List   Diagnosis Date Noted   Hypothyroidism 06/20/2007   GRAVE'S DISEASE, HX OF 06/20/2007    PCP: Roseanne Reno, MD  REFERRING PROVIDER: Andria Frames, PA  REFERRING DIAG: left hip pain  THERAPY DIAG:  Pain in left hip  Difficulty in walking, not elsewhere classified  Muscle weakness (generalized)  Rationale for Evaluation and Treatment: Rehabilitation  ONSET DATE: 07/15/23  SUBJECTIVE:   SUBJECTIVE STATEMENT: Patient reports left hip pain for a number of months, not sure of a cause, does report some back pain.    PERTINENT HISTORY: See above PAIN:  Are you having pain? Yes: NPRS scale: 2/10 Pain location: left buttock and the back Pain description: ache, tight Aggravating factors: walking, standing pain 6/10, turn over in bed Relieving factors: change positions sometimes pain is 0/10  PRECAUTIONS: None  RED FLAGS: None   WEIGHT BEARING RESTRICTIONS: No  FALLS:  Has patient fallen in last 6 months? No  LIVING ENVIRONMENT: Lives with: lives with their family Lives  in: House/apartment Stairs: Yes: Internal: 12 steps; can reach both Has following equipment at home: None  OCCUPATION: retired  PLOF: Independent and house work, yard work, gardening, some walking  PATIENT GOALS: have less pain, move better  NEXT MD VISIT: none scheduled  OBJECTIVE:  Note: Objective measures were completed at Evaluation unless otherwise noted.  DIAGNOSTIC FINDINGS: x-rays negative  PATIENT SURVEYS:  FOTO 56  COGNITION: Overall cognitive status: Within functional limits for tasks assessed     SENSATION: WFL  MUSCLE LENGTH: Mild tightness iwht HS, piriformis and ITB  POSTURE: forward head and decreased lumbar lordosis  PALPATION: Left buttock and back, very tight and tender   LOWER EXTREMITY ROM: WFL's mild tightness   LOWER EXTREMITY MMT:  4-/5 for hips with some pain for ER/IR  LOWER EXTREMITY SPECIAL TESTS:  Hip special tests: Luisa Hart (FABER) test: negative, Trendelenburg test: negative, Thomas test: negative, Hip scouring test: negative, and Piriformis test: positive   FUNCTIONAL TESTS:  5 times sit to stand: 19 Timed up and go (TUG): 14  GAIT: Distance walked: 100 Assistive device utilized: None Level of assistance: Complete Independence Comments: mild limp on the left only with the turn   TODAY'S TREATMENT:  DATE:     PATIENT EDUCATION:  Education details: self massage with tennis ball, POC Person educated: Patient and Spouse Education method: Explanation, Demonstration, Verbal cues, and Handouts Education comprehension: verbalized understanding, returned demonstration, verbal cues required, and tactile cues required  HOME EXERCISE PROGRAM: Access Code: [email or web address] URL: [email or web address] Date: 07/22/2023 Prepared by: Stacie Glaze  Exercises - Supine Piriformis Stretch Pulling Heel to  Hip  - 2 x daily - 7 x weekly - 1 sets - 5 reps - 20 hold - Supine ITB Stretch with Strap  - 2 x daily - 7 x weekly - 1 sets - 5 reps - 20 hold  ASSESSMENT:  CLINICAL IMPRESSION: Patient is a 67 y.o. female who was seen today for physical therapy evaluation and treatment for left hip and back pain.  Reports onset about 2 months ago unsure of cause, hurts with standing and walking and rolling over in bed, very tender and painful in the left buttock and in the lumbar pspinals   OBJECTIVE IMPAIRMENTS: Abnormal gait, cardiopulmonary status limiting activity, decreased activity tolerance, decreased balance, decreased coordination, decreased endurance, decreased mobility, difficulty walking, decreased ROM, decreased strength, increased fascial restrictions, increased muscle spasms, impaired flexibility, improper body mechanics, postural dysfunction, and pain.   REHAB POTENTIAL: Good  CLINICAL DECISION MAKING: Stable/uncomplicated  EVALUATION COMPLEXITY: Low   GOALS: Goals reviewed with patient? Yes  SHORT TERM GOALS: Target date: 08/06/23 Independent with initial HEP Goal status: INITIAL  LONG TERM GOALS: Target date: 10/22/23  Independent with advanced HEP Goal status: INITIAL  2.  Report pain overall decreased 50% Goal status: INITIAL  3.  Report no pain with rolling over in bed Goal status: INITIAL  4.  Increase hip strength to 4/5 Goal status: INITIAL  PLAN:  PT FREQUENCY: 1-2x/week  PT DURATION: 12 weeks  PLANNED INTERVENTIONS: 97164- PT Re-evaluation, 97110-Therapeutic exercises, 97530- Therapeutic activity, 97112- Neuromuscular re-education, 97535- Self Care, 44034- Manual therapy, [id number]- Gait training, 97014- Electrical stimulation (unattended), 97035- Ultrasound, 74259- Traction (mechanical), [id number]- Ionotophoresis 4mg /ml Dexamethasone, Patient/Family education, Balance training, Stair training, Taping, Dry Needling, Joint mobilization, Spinal mobilization, Cryotherapy,  and Moist heat  PLAN FOR NEXT SESSION: Patient concerned about copay, I instructed in self massage and stretches will ask for a few visits spread out over a long period   Alayziah Tangeman W, PT 07/22/2023, 10:21 AM

## 2024-01-19 DIAGNOSIS — J209 Acute bronchitis, unspecified: Secondary | ICD-10-CM | POA: Diagnosis not present

## 2024-01-24 DIAGNOSIS — E041 Nontoxic single thyroid nodule: Secondary | ICD-10-CM | POA: Diagnosis not present

## 2024-01-24 DIAGNOSIS — R7303 Prediabetes: Secondary | ICD-10-CM | POA: Diagnosis not present

## 2024-01-24 DIAGNOSIS — E890 Postprocedural hypothyroidism: Secondary | ICD-10-CM | POA: Diagnosis not present

## 2024-01-24 DIAGNOSIS — E0500 Thyrotoxicosis with diffuse goiter without thyrotoxic crisis or storm: Secondary | ICD-10-CM | POA: Diagnosis not present

## 2024-02-04 DIAGNOSIS — R059 Cough, unspecified: Secondary | ICD-10-CM | POA: Diagnosis not present

## 2024-02-20 DIAGNOSIS — L309 Dermatitis, unspecified: Secondary | ICD-10-CM | POA: Diagnosis not present

## 2024-03-16 DIAGNOSIS — M542 Cervicalgia: Secondary | ICD-10-CM | POA: Diagnosis not present

## 2024-03-16 DIAGNOSIS — M545 Low back pain, unspecified: Secondary | ICD-10-CM | POA: Diagnosis not present

## 2024-03-29 DIAGNOSIS — M542 Cervicalgia: Secondary | ICD-10-CM | POA: Diagnosis not present

## 2024-03-29 DIAGNOSIS — M545 Low back pain, unspecified: Secondary | ICD-10-CM | POA: Diagnosis not present

## 2024-04-05 DIAGNOSIS — Z860100 Personal history of colon polyps, unspecified: Secondary | ICD-10-CM | POA: Diagnosis not present

## 2024-04-05 DIAGNOSIS — R1013 Epigastric pain: Secondary | ICD-10-CM | POA: Diagnosis not present

## 2024-04-05 DIAGNOSIS — K861 Other chronic pancreatitis: Secondary | ICD-10-CM | POA: Diagnosis not present

## 2024-04-06 DIAGNOSIS — M542 Cervicalgia: Secondary | ICD-10-CM | POA: Diagnosis not present

## 2024-04-06 DIAGNOSIS — M545 Low back pain, unspecified: Secondary | ICD-10-CM | POA: Diagnosis not present

## 2024-04-13 DIAGNOSIS — M545 Low back pain, unspecified: Secondary | ICD-10-CM | POA: Diagnosis not present

## 2024-04-13 DIAGNOSIS — M542 Cervicalgia: Secondary | ICD-10-CM | POA: Diagnosis not present

## 2024-04-21 DIAGNOSIS — M542 Cervicalgia: Secondary | ICD-10-CM | POA: Diagnosis not present

## 2024-04-21 DIAGNOSIS — M545 Low back pain, unspecified: Secondary | ICD-10-CM | POA: Diagnosis not present

## 2024-04-27 DIAGNOSIS — H2513 Age-related nuclear cataract, bilateral: Secondary | ICD-10-CM | POA: Diagnosis not present

## 2024-04-27 DIAGNOSIS — H43813 Vitreous degeneration, bilateral: Secondary | ICD-10-CM | POA: Diagnosis not present

## 2024-04-27 DIAGNOSIS — H35372 Puckering of macula, left eye: Secondary | ICD-10-CM | POA: Diagnosis not present

## 2024-05-16 DIAGNOSIS — R7303 Prediabetes: Secondary | ICD-10-CM | POA: Diagnosis not present

## 2024-05-16 DIAGNOSIS — E890 Postprocedural hypothyroidism: Secondary | ICD-10-CM | POA: Diagnosis not present

## 2024-05-16 DIAGNOSIS — E7800 Pure hypercholesterolemia, unspecified: Secondary | ICD-10-CM | POA: Diagnosis not present

## 2024-05-16 DIAGNOSIS — E538 Deficiency of other specified B group vitamins: Secondary | ICD-10-CM | POA: Diagnosis not present

## 2024-05-21 DIAGNOSIS — E7800 Pure hypercholesterolemia, unspecified: Secondary | ICD-10-CM | POA: Diagnosis not present

## 2024-05-21 DIAGNOSIS — K861 Other chronic pancreatitis: Secondary | ICD-10-CM | POA: Diagnosis not present

## 2024-05-21 DIAGNOSIS — Z0000 Encounter for general adult medical examination without abnormal findings: Secondary | ICD-10-CM | POA: Diagnosis not present

## 2024-05-21 DIAGNOSIS — E890 Postprocedural hypothyroidism: Secondary | ICD-10-CM | POA: Diagnosis not present

## 2024-05-21 DIAGNOSIS — M8588 Other specified disorders of bone density and structure, other site: Secondary | ICD-10-CM | POA: Diagnosis not present

## 2024-05-21 DIAGNOSIS — E538 Deficiency of other specified B group vitamins: Secondary | ICD-10-CM | POA: Diagnosis not present
